# Patient Record
Sex: Female | Born: 1980 | Race: White | Hispanic: Yes | Marital: Married | Smoking: Never smoker
Health system: Southern US, Community
[De-identification: ages and names within clinical notes are randomized; demographics above are authoritative.]

## PROBLEM LIST (undated history)

## (undated) DIAGNOSIS — O039 Complete or unspecified spontaneous abortion without complication: Secondary | ICD-10-CM

## (undated) DIAGNOSIS — O24419 Gestational diabetes mellitus in pregnancy, unspecified control: Secondary | ICD-10-CM

## (undated) DIAGNOSIS — F419 Anxiety disorder, unspecified: Secondary | ICD-10-CM

## (undated) DIAGNOSIS — R7303 Prediabetes: Secondary | ICD-10-CM

## (undated) HISTORY — DX: Complete or unspecified spontaneous abortion without complication: O03.9

## (undated) HISTORY — DX: Anxiety disorder, unspecified: F41.9

## (undated) HISTORY — DX: Gestational diabetes mellitus in pregnancy, unspecified control: O24.419

## (undated) HISTORY — PX: DILATION AND CURETTAGE OF UTERUS: SHX78

---

## 2005-10-22 ENCOUNTER — Emergency Department: Payer: Self-pay | Admitting: Emergency Medicine

## 2005-12-04 ENCOUNTER — Ambulatory Visit: Payer: Self-pay | Admitting: Obstetrics and Gynecology

## 2006-06-15 ENCOUNTER — Inpatient Hospital Stay: Payer: Self-pay | Admitting: Obstetrics and Gynecology

## 2008-06-20 ENCOUNTER — Ambulatory Visit: Payer: Self-pay | Admitting: Internal Medicine

## 2013-05-26 HISTORY — PX: AUGMENTATION MAMMAPLASTY: SUR837

## 2014-07-26 ENCOUNTER — Ambulatory Visit (INDEPENDENT_AMBULATORY_CARE_PROVIDER_SITE_OTHER): Payer: Self-pay | Admitting: Sports Medicine

## 2014-07-26 VITALS — BP 100/62 | HR 67 | Temp 97.7°F | Ht 62.0 in | Wt 148.4 lb

## 2014-07-26 DIAGNOSIS — M545 Low back pain, unspecified: Secondary | ICD-10-CM

## 2014-07-26 MED ORDER — MELOXICAM 15 MG PO TABS: 15.0000 mg | ORAL_TABLET | Freq: Every morning | ORAL | Status: DC

## 2014-07-26 MED ORDER — CYCLOBENZAPRINE HCL 5 MG PO TABS: 5.0000 mg | ORAL_TABLET | Freq: Every day | ORAL | Status: DC

## 2014-07-26 NOTE — Progress Notes (Signed)
  Kristie ChampagneClaudia Plata Ornelas - 34 y.o. female MRN [id number]  Date of birth: 1980-09-17  SUBJECTIVE:  Including CC & ROS.  patient C/O: low back pain  Onset of symptoms: 08/20/14 after pushing something at home  Symptoms: low back muscle pain that cramping in nature across the back not isolated to one side. Diffuse soreness with no radiation of pain into the buttock, thighs or lower legs. No numbness no tingling or sharp shooting pain Relieving factors: nothing OTC Worsened by: standing for long period of time and difficulty to find a comfortable position when laying down   ROS:  Constitutional:  No fever, chills, or fatigue.  Respiratory:  No shortness of breath, cough, or wheezing Cardiovascular:  No palpitations, chest pain or syncope Gastrointestinal:  No nausea, no abdominal pain Review of systems otherwise negative except for what is stated in HPI  HISTORY: Past Medical, Surgical, Social, and Family History Reviewed & Updated per EMR. Pertinent Historical Findings include: No chronic medication problems or medications  PHYSICAL EXAM:  VS: BP:100/62 mmHg  HR:67bpm  TEMP:97.7 F (36.5 C)(Oral)  RESP:99 %  HT:5\' 2"  (157.5 cm)   WT:148 lb 6 oz (67.302 kg)  BMI:27.2 LOW BACK EXAM: General: well nourished Skin of LE: warm; dry, no rashes, lesions, ecchymosis or erythema. Vascular: radial pulses 2+ bilaterally Neurologically: Sensation to light touch lower extremities equal and intact bilaterally.  Observation: Normal curvature and no kyphosis or lordosis, no scoliosis.  Iliac crests are symmetric, shoulders line symmetrically Palpation:  No step off defects noted in the thoracic or lumbar spine.   No muscle spasm or tenderness along the paraspinal musculature of the thoracic No muscle spasm or tenderness along the paraspinal musculature of the lumbar spine. Range of motion:  Normal flexion on forward bending, no pain with extension, no pain with one leg hyperextension. Neuromuscular:  No pain with straight leg raise left or right, normal gait walking with walking on heels or walking on toes, normal tandem gait.  Nerve root intervention  L2 and L3: Normal hip flexion with no weakness. L2, L3, L4: Normal hip abduction bilaterally.  Normal patellar DTR +2 bilaterally L4, L5, S1: Normal hip abduction bilaterally S1 and S2: Normal ankle plantar-flexion bilaterally.  Normal Achilles tendon DTR +2 bilaterally L5: Normal extensor hallucis longus bilaterally   ASSESSMENT & PLAN:  Impression: Lumbar back pain without sciatica   Patient is a 34 year old Hispanic female presented with low back pain mechanical in nature. Patient's history did not specify any symptoms of radicular symptoms numbness or tingling in the lower extremity. Examination did not reveal any reproducible radicular symptoms no weakness normal sensation. Suspect mechanical back pain for muscle spasms. Will treat with anti-inflammatories and muscle relaxer. Patient was started on low back 50 mg daily with breakfast for the next 2 weeks. Also provided a muscle relaxer to be used at night as needed. Patient advised to follow-up in 3 or 4 weeks if symptoms not improved. Also given some lumbar flexibility exercises to work on stretching. Patient reports she is able to do her job with no limitations.

## 2016-10-08 ENCOUNTER — Emergency Department
Admission: EM | Admit: 2016-10-08 | Discharge: 2016-10-08 | Disposition: A | Discharge status: Home / Self Care | Payer: BLUE CROSS/BLUE SHIELD | Attending: Emergency Medicine | Admitting: Emergency Medicine

## 2016-10-08 ENCOUNTER — Emergency Department: Payer: BLUE CROSS/BLUE SHIELD

## 2016-10-08 DIAGNOSIS — B373 Candidiasis of vulva and vagina: Secondary | ICD-10-CM | POA: Diagnosis not present

## 2016-10-08 DIAGNOSIS — B3731 Acute candidiasis of vulva and vagina: Secondary | ICD-10-CM

## 2016-10-08 DIAGNOSIS — R1032 Left lower quadrant pain: Secondary | ICD-10-CM | POA: Insufficient documentation

## 2016-10-08 LAB — COMPREHENSIVE METABOLIC PANEL
ALK PHOS: 63 U/L (ref 38–126)
ALT: 38 U/L (ref 14–54)
ANION GAP: 7 (ref 5–15)
AST: 51 U/L — ABNORMAL HIGH (ref 15–41)
Albumin: 4.2 g/dL (ref 3.5–5.0)
BUN: 16 mg/dL (ref 6–20)
CALCIUM: 8.6 mg/dL — AB (ref 8.9–10.3)
CO2: 24 mmol/L (ref 22–32)
Chloride: 105 mmol/L (ref 101–111)
Creatinine, Ser: 0.68 mg/dL (ref 0.44–1.00)
Glucose, Bld: 128 mg/dL — ABNORMAL HIGH (ref 65–99)
Potassium: 3.7 mmol/L (ref 3.5–5.1)
SODIUM: 136 mmol/L (ref 135–145)
TOTAL PROTEIN: 7.4 g/dL (ref 6.5–8.1)
Total Bilirubin: 0.6 mg/dL (ref 0.3–1.2)

## 2016-10-08 LAB — URINALYSIS, COMPLETE (UACMP) WITH MICROSCOPIC
BILIRUBIN URINE: NEGATIVE
Bacteria, UA: NONE SEEN
GLUCOSE, UA: NEGATIVE mg/dL
HGB URINE DIPSTICK: NEGATIVE
KETONES UR: NEGATIVE mg/dL
LEUKOCYTES UA: NEGATIVE
NITRITE: NEGATIVE
PROTEIN: NEGATIVE mg/dL
Specific Gravity, Urine: 1.024 (ref 1.005–1.030)
pH: 6 (ref 5.0–8.0)

## 2016-10-08 LAB — CBC
HCT: 38 % (ref 35.0–47.0)
HEMOGLOBIN: 13.2 g/dL (ref 12.0–16.0)
MCH: 31.7 pg (ref 26.0–34.0)
MCHC: 34.8 g/dL (ref 32.0–36.0)
MCV: 91.2 fL (ref 80.0–100.0)
PLATELETS: 274 10*3/uL (ref 150–440)
RBC: 4.17 MIL/uL (ref 3.80–5.20)
RDW: 12.9 % (ref 11.5–14.5)
WBC: 8.5 10*3/uL (ref 3.6–11.0)

## 2016-10-08 LAB — WET PREP, GENITAL
CLUE CELLS WET PREP: NONE SEEN
Sperm: NONE SEEN
Trich, Wet Prep: NONE SEEN

## 2016-10-08 LAB — CHLAMYDIA/NGC RT PCR (ARMC ONLY)
CHLAMYDIA TR: NOT DETECTED
N GONORRHOEAE: NOT DETECTED

## 2016-10-08 LAB — POCT PREGNANCY, URINE: PREG TEST UR: NEGATIVE

## 2016-10-08 LAB — LIPASE, BLOOD: Lipase: 23 U/L (ref 11–51)

## 2016-10-08 MED ORDER — IOPAMIDOL (ISOVUE-300) INJECTION 61%
100.0000 mL | Freq: Once | INTRAVENOUS | Status: AC | PRN
  Administered 2016-10-08: 100 mL via INTRAVENOUS

## 2016-10-08 MED ORDER — FLUCONAZOLE 100 MG PO TABS
150.0000 mg | ORAL_TABLET | Freq: Once | ORAL | Status: AC
  Administered 2016-10-08: 21:00:00 150 mg via ORAL
  Filled 2016-10-08: qty 1

## 2016-10-08 MED ORDER — IOPAMIDOL (ISOVUE-300) INJECTION 61%
30.0000 mL | Freq: Once | INTRAVENOUS | Status: AC
  Administered 2016-10-08: 30 mL via ORAL

## 2016-10-08 MED ORDER — ONDANSETRON HCL 4 MG/2ML IJ SOLN
INTRAMUSCULAR | Status: AC
  Administered 2016-10-08: 4 mg via INTRAVENOUS
  Filled 2016-10-08: qty 2

## 2016-10-08 MED ORDER — IBUPROFEN 800 MG PO TABS
800.0000 mg | ORAL_TABLET | Freq: Once | ORAL | Status: AC
  Administered 2016-10-08: 800 mg via ORAL
  Filled 2016-10-08: qty 1

## 2016-10-08 MED ORDER — ONDANSETRON HCL 4 MG/2ML IJ SOLN
4.0000 mg | Freq: Once | INTRAMUSCULAR | Status: AC
  Administered 2016-10-08: 4 mg via INTRAVENOUS

## 2016-10-08 NOTE — ED Notes (Signed)
Dr. Pershing ProudSchaevitz and [place] Villita NationHiram, interpreter at bedside.

## 2016-10-08 NOTE — ED Notes (Signed)
Pt sitting in chair in room with visitor. No distress noted. Hiram, spanish interpreter and DR. Lord at bedside  LLQ abd pain x 3 days. This past week pain is constant and stabbing. Sudden movement brings pain to pelvic area. Denies urinary pain or blood in urine. States first week of pain stated frequency. Denies chance of pregnancy, denies pain in back. Denies vaginal discharge. Denies diarrhea, constipation, N&V, fever.

## 2016-10-08 NOTE — ED Provider Notes (Signed)
[facility] Emergency Department Provider Note ____________________________________________   I have reviewed the triage vital signs and the triage nursing note.  HISTORY  Chief Complaint Abdominal Pain   Historian Patient and spouse  HPI Kristie Stevens is a 36 y.o. female presenting with several weeks but worsening few days with umbilical and left lower quadrant pain which is intermittent and at times sharp.  Occasional nausea without vomiting. No cuts patient diarrhea. Denies abnormal vaginal bleeding. Denies any abnormal vaginal discharge. No fevers. Pain is moderate to severe at times and currently moderate and made worse by movements.    History reviewed. No pertinent past medical history.  There are no active problems to display for this patient.   Past Surgical History:  Procedure Laterality Date  . CESAREAN SECTION      Prior to Admission medications   Medication Sig Start Date End Date Taking? Authorizing Provider  cyclobenzaprine (FLEXERIL) 5 MG tablet Take 1 tablet (5 mg total) by mouth at bedtime. Patient not taking: Reported on 10/08/2016 07/26/14   Didiano, Deanna M, DO  meloxicam (MOBIC) 15 MG tablet Take 1 tablet (15 mg total) by mouth every morning. With breakfast for 14 days then as needed Patient not taking: Reported on 10/08/2016 07/26/14   Didiano, Deanna M, DO    No Known Allergies  Family History  Problem Relation Age of Onset  . Hyperlipidemia Father   . Hyperlipidemia Brother   . Diabetes Paternal Grandmother     Social History Social History  Substance Use Topics  . Smoking status: Never Smoker  . Smokeless tobacco: Never Used  . Alcohol use 0.0 oz/week     Comment: occ    Review of Systems  Constitutional: Negative for fever. Eyes: Negative for visual changes. ENT: Negative for sore throat. Cardiovascular: Negative for chest pain. Respiratory: Negative for shortness of breath. Gastrointestinal: Negative  for vomiting and diarrhea. Genitourinary: Negative for dysuria. Musculoskeletal: Negative for back pain. Skin: Negative for rash. Neurological: Negative for headache. ____________________________________________   PHYSICAL EXAM:  VITAL SIGNS: ED Triage Vitals  Enc Vitals Group     BP 10/08/16 1821 116/80     Pulse Rate 10/08/16 1821 92     Resp 10/08/16 1821 18     Temp 10/08/16 1821 99 F (37.2 C)     Temp Source 10/08/16 1821 Oral     SpO2 10/08/16 1821 100 %     Weight 10/08/16 1828 155 lb (70.3 kg)     Height 10/08/16 1828 5\' 2"  (1.575 m)     Head Circumference --      Peak Flow --      Pain Score 10/08/16 1821 5     Pain Loc --      Pain Edu? --      Excl. in GC? --      Constitutional: Alert and oriented. Well appearing and in no distress. HEENT   Head: Normocephalic and atraumatic.      Eyes: Conjunctivae are normal. PER      Ears:         Nose: No congestion/rhinnorhea.   Mouth/Throat: Mucous membranes are moist.   Neck: No stridor. Cardiovascular/Chest: Normal rate, regular rhythm.  No murmurs, rubs, or gallops. Respiratory: Normal respiratory effort without tachypnea nor retractions. Breath sounds are clear and equal bilaterally. No wheezes/rales/rhonchi. Gastrointestinal: Soft. No distention, no guarding, no rebound. Moderate left lower quadrant tenderness to palpation.  Genitourinary/rectal: Small amount of vaginal discharge. No cervicitis or adnexal  mass or tenderness on pelvic bimanual exam. Musculoskeletal: Nontender with normal range of motion in all extremities. No joint effusions.  No lower extremity tenderness.  No edema. Neurologic:  Normal speech and language. No gross or focal neurologic deficits are appreciated. Skin:  Skin is warm, dry and intact. No rash noted. Psychiatric: Mood and affect are normal. Speech and behavior are normal. Patient exhibits appropriate insight and  judgment.   ____________________________________________  LABS (pertinent positives/negatives)  Labs Reviewed  WET PREP, GENITAL - Abnormal; Notable for the following:       Result Value   Yeast Wet Prep HPF POC PRESENT (*)    WBC, Wet Prep HPF POC FEW (*)    All other components within normal limits  COMPREHENSIVE METABOLIC PANEL - Abnormal; Notable for the following:    Glucose, Bld 128 (*)    Calcium 8.6 (*)    AST 51 (*)    All other components within normal limits  URINALYSIS, COMPLETE (UACMP) WITH MICROSCOPIC - Abnormal; Notable for the following:    Color, Urine YELLOW (*)    APPearance CLEAR (*)    Squamous Epithelial / LPF 0-5 (*)    All other components within normal limits  CHLAMYDIA/NGC RT PCR ([facility] ONLY)  LIPASE, BLOOD  CBC  POCT PREGNANCY, URINE    ____________________________________________    EKG I, Governor Rooksebecca Matthewjames Petrasek, MD, the attending physician have personally viewed and interpreted all ECGs.  None ____________________________________________  RADIOLOGY All Xrays were viewed by me. Imaging interpreted by Radiologist.  Pelvic and transvaginal ultrasound:  IMPRESSION: Normal pelvic ultrasound.  No acute abnormality identified.  CT abdomen and pelvis with contrast: Pending __________________________________________  PROCEDURES  Procedure(s) performed: None  Critical Care performed: None  ____________________________________________   ED COURSE / ASSESSMENT AND PLAN  Pertinent labs & imaging results that were available during my care of the patient were reviewed by me and considered in my medical decision making (see chart for details).   There is diminished interpreter, patient describes left lower quadrant pain wet with some worsening with movements and some located more periumbilical. Ultrasound is reassuring. Urinalysis is reassuring. Ultrasound is normal.  I will obtain CT of abdomen and pelvis for further  investigation.  Given dose of Diflucan for yeast.  Patient care to be transferred to Dr. Langston MaskerShaevitz at shift change 9 PM. CT abdomen and pelvis is pending.   CONSULTATIONS: None   Patient / Family / Caregiver informed of clinical course, medical decision-making process, and agree with plan.    ___________________________________________   FINAL CLINICAL IMPRESSION(S) / ED DIAGNOSES   Final diagnoses:  Left lower quadrant pain              Note: This dictation was prepared with Dragon dictation. Any transcriptional errors that result from this process are unintentional    Governor RooksLord, Jewel Mcafee, MD 10/08/16 2047

## 2016-10-08 NOTE — ED Notes (Signed)
Pt returned from CT via stretcher.

## 2016-10-08 NOTE — ED Triage Notes (Signed)
Pt presents with c/o of LLQ abdominal pain that has been going on for 2 weeks. Pt states it has gotten worse in the last week. Pt denies any pain with urination, bleeding or vaginal discharge. Denies N/V.

## 2016-12-18 ENCOUNTER — Other Ambulatory Visit: Payer: Self-pay | Admitting: Obstetrics and Gynecology

## 2016-12-18 DIAGNOSIS — Z1231 Encounter for screening mammogram for malignant neoplasm of breast: Secondary | ICD-10-CM

## 2017-01-29 DIAGNOSIS — L819 Disorder of pigmentation, unspecified: Secondary | ICD-10-CM | POA: Insufficient documentation

## 2017-03-30 ENCOUNTER — Other Ambulatory Visit: Payer: Self-pay | Admitting: Obstetrics and Gynecology

## 2017-03-30 DIAGNOSIS — Z9882 Breast implant status: Secondary | ICD-10-CM

## 2017-03-30 DIAGNOSIS — N644 Mastodynia: Secondary | ICD-10-CM

## 2017-04-02 ENCOUNTER — Ambulatory Visit
Admission: RE | Admit: 2017-04-02 | Discharge: 2017-04-02 | Disposition: A | Discharge status: Home / Self Care | Payer: BLUE CROSS/BLUE SHIELD | Source: Ambulatory Visit | Attending: Obstetrics and Gynecology | Admitting: Obstetrics and Gynecology

## 2017-04-02 DIAGNOSIS — N644 Mastodynia: Secondary | ICD-10-CM | POA: Diagnosis present

## 2017-04-02 DIAGNOSIS — Z9882 Breast implant status: Secondary | ICD-10-CM | POA: Diagnosis present

## 2017-11-13 LAB — OB RESULTS CONSOLE HEPATITIS B SURFACE ANTIGEN: Hepatitis B Surface Ag: NEGATIVE

## 2017-11-13 LAB — OB RESULTS CONSOLE VARICELLA ZOSTER ANTIBODY, IGG: Varicella: IMMUNE

## 2017-11-13 LAB — OB RESULTS CONSOLE RUBELLA ANTIBODY, IGM: Rubella: IMMUNE

## 2017-11-13 LAB — OB RESULTS CONSOLE HIV ANTIBODY (ROUTINE TESTING): HIV: NONREACTIVE

## 2018-03-24 ENCOUNTER — Ambulatory Visit: Payer: BLUE CROSS/BLUE SHIELD | Admitting: *Deleted

## 2018-03-25 ENCOUNTER — Encounter: Payer: Self-pay | Admitting: Dietician

## 2018-03-25 ENCOUNTER — Ambulatory Visit: Payer: Medicaid Other | Admitting: Dietician

## 2018-03-25 ENCOUNTER — Encounter: Payer: Medicaid Other | Attending: Obstetrics and Gynecology | Admitting: Dietician

## 2018-03-25 VITALS — BP 106/68 | Ht 62.0 in | Wt 172.3 lb

## 2018-03-25 DIAGNOSIS — O24419 Gestational diabetes mellitus in pregnancy, unspecified control: Secondary | ICD-10-CM | POA: Diagnosis not present

## 2018-03-25 DIAGNOSIS — Z3A00 Weeks of gestation of pregnancy not specified: Secondary | ICD-10-CM | POA: Diagnosis not present

## 2018-03-25 DIAGNOSIS — Z713 Dietary counseling and surveillance: Secondary | ICD-10-CM | POA: Insufficient documentation

## 2018-03-25 DIAGNOSIS — O24410 Gestational diabetes mellitus in pregnancy, diet controlled: Secondary | ICD-10-CM

## 2018-03-25 NOTE — Progress Notes (Signed)
Appt. Start Time:1045 Appt. End Time: 1215  GDM Class 1 Diabetes Overview - define DM; state own type of DM; identify functions of pancreas and insulin; define insulin deficiency vs insulin resistance  Psychosocial - identify DM as a source of stress; state the effects of stress on BG control; verbalize appropriate stress management techniques; identify personal stress issues   Nutritional Management - describe effects of food on blood glucose; identify sources of carbohydrate, protein and fat; verbalize the importance of balance meals in controlling blood glucose; identify meals as well balanced or not; estimate servings of carbohydrate from menus; use food labels to identify servings size, content of carbohydrate, fiber, protein, fat, saturated fat and sodium; recognize food sources of fat, saturated fat, trans fat, sodium and verbalize goals for intake; describe healthful appropriate food choices when dining out   Exercise - describe the effects of exercise on blood glucose and importance of regular exercise in controlling diabetes; state a plan for personal exercise; verbalize contraindications for exercise  Medications - state name, dose, timing of currently prescribed medications; describe types of medications available for diabetes  Insulin Training - prepare and administer insulin accurately; state correct rotation pattern; verbalize safe and lawful needle disposal  Self-Monitoring - state importance of HBGM and demo procedure accurately; use HBGM results to effectively manage diabetes; identify importance of regular HbA1C testing and goals for results  Acute Complications/Sick Day Guidelines - recognize hyperglycemia and hypoglycemia with causes and effects; identify blood glucose results as high, low or in control; list steps in treating and preventing high and low blood glucose; state appropriate measure to manage blood glucose when ill (need for meds, HBGM plan, when to call physician,  need for fluids)  Chronic Complications/Foot, Skin, Eye Dental Care - identify possible long-term complications of diabetes (retinopathy, neuropathy, nephropathy, cardiovascular disease, infections); explain steps in prevention and treatment of chronic complications; state importance of daily self-foot exams; describe how to examine feet and what to look for; explain appropriate eye and dental care  Lifestyle Changes/Goals & Health/Community Resources - state benefits of making appropriate lifestyle changes; identify habits that need to change (meals, tobacco, alcohol); identify strategies to reduce risk factors for personal health; set goals for proper diabetes care; state need for and frequency of healthcare follow-up; describe appropriate community resources for good health (ADA, web sites, apps)   Pregnancy/Sexual Health - define gestational diabetes; state importance of good blood glucose control and birth control prior to pregnancy; state importance of good blood glucose control in preventing sexual problems (impotence, vaginal dryness, infections, loss of desire); state relationship of blood glucose control and pregnancy outcome; describe risk of maternal and fetal complications  Teaching Materials Used: Meter-Accuchek Guide Me General Meal Planning Guidelines Daily Food Record Gestational Diabetes Booklet Gestational Video Goals for Healthy Pregnancy

## 2018-03-30 ENCOUNTER — Ambulatory Visit: Payer: Medicaid Other | Admitting: Dietician

## 2018-03-31 ENCOUNTER — Encounter: Payer: Self-pay | Admitting: Dietician

## 2018-03-31 ENCOUNTER — Encounter: Payer: Medicaid Other | Attending: Obstetrics and Gynecology | Admitting: Dietician

## 2018-03-31 VITALS — BP 90/50 | Ht 62.0 in | Wt 172.8 lb

## 2018-03-31 DIAGNOSIS — Z713 Dietary counseling and surveillance: Secondary | ICD-10-CM | POA: Insufficient documentation

## 2018-03-31 DIAGNOSIS — O24419 Gestational diabetes mellitus in pregnancy, unspecified control: Secondary | ICD-10-CM | POA: Insufficient documentation

## 2018-03-31 DIAGNOSIS — Z3A00 Weeks of gestation of pregnancy not specified: Secondary | ICD-10-CM | POA: Diagnosis not present

## 2018-03-31 DIAGNOSIS — O24410 Gestational diabetes mellitus in pregnancy, diet controlled: Secondary | ICD-10-CM

## 2018-03-31 NOTE — Progress Notes (Signed)
   Patient's BG record indicates BGs are within goal ranges, with fasting BGs ranging 74-93, and post-meal BGs ranging 67-105, with 2 readings of 125 after eating oatmeal (patient states no added sugar, small portion).  Patient's food diary indicates healthy food choices, with most meals low in carbohydrates.    Provided 1600-1700kcal meal plan, and wrote individualized menus based on patient's food preferences. Encouraged appropriate portions of healthy carbohydrates. Instructed patient on basic meal planning to achieve good nutritional balance.   Instructed patient on food safety, including avoidance of Listeriosis, and limiting mercury from fish.  Discussed importance of maintaining healthy lifestyle habits to reduce risk of Type 2 DM as well as Gestational DM with any future pregnancies.  Advised patient to use any remaining testing supplies to test some BGs after delivery, and to have BG tested ideally annually, as well as prior to attempting future pregnancies.

## 2018-03-31 NOTE — Patient Instructions (Addendum)
   Try "Steel Cut" oats for breakfast, and keep to 1/2 cup before cooking (1 cup after cooking). Add 1-2 boiled eggs (huevos) or 1/4 cup nuts (nueces) for protein and fat for a balanced meal.  OK to add some carbohydrate into meals, in small- medium portions.   Good job eating healthy foods and staying active.

## 2018-04-26 LAB — OB RESULTS CONSOLE GBS: GBS: NEGATIVE

## 2018-04-26 LAB — OB RESULTS CONSOLE GC/CHLAMYDIA
Chlamydia: NEGATIVE
Gonorrhea: NEGATIVE

## 2018-04-26 LAB — OB RESULTS CONSOLE RPR: RPR: NONREACTIVE

## 2018-05-20 ENCOUNTER — Other Ambulatory Visit: Payer: Self-pay

## 2018-05-20 ENCOUNTER — Inpatient Hospital Stay: Payer: Medicaid Other | Admitting: Anesthesiology

## 2018-05-20 ENCOUNTER — Inpatient Hospital Stay
Admission: EM | Admit: 2018-05-20 | Discharge: 2018-05-23 | DRG: 787 | Disposition: A | Discharge status: Home / Self Care | Payer: Medicaid Other | Attending: Obstetrics and Gynecology | Admitting: Obstetrics and Gynecology

## 2018-05-20 DIAGNOSIS — Z3A40 40 weeks gestation of pregnancy: Secondary | ICD-10-CM | POA: Diagnosis not present

## 2018-05-20 DIAGNOSIS — O4292 Full-term premature rupture of membranes, unspecified as to length of time between rupture and onset of labor: Principal | ICD-10-CM | POA: Diagnosis present

## 2018-05-20 DIAGNOSIS — O9081 Anemia of the puerperium: Secondary | ICD-10-CM | POA: Diagnosis not present

## 2018-05-20 DIAGNOSIS — O34211 Maternal care for low transverse scar from previous cesarean delivery: Secondary | ICD-10-CM | POA: Diagnosis present

## 2018-05-20 DIAGNOSIS — D62 Acute posthemorrhagic anemia: Secondary | ICD-10-CM | POA: Diagnosis not present

## 2018-05-20 DIAGNOSIS — O24420 Gestational diabetes mellitus in childbirth, diet controlled: Secondary | ICD-10-CM | POA: Diagnosis present

## 2018-05-20 LAB — COMPREHENSIVE METABOLIC PANEL WITH GFR
ALT: 15 U/L (ref 0–44)
AST: 17 U/L (ref 15–41)
Albumin: 2.9 g/dL — ABNORMAL LOW (ref 3.5–5.0)
Alkaline Phosphatase: 169 U/L — ABNORMAL HIGH (ref 38–126)
Anion gap: 8 (ref 5–15)
BUN: 10 mg/dL (ref 6–20)
CO2: 21 mmol/L — ABNORMAL LOW (ref 22–32)
Calcium: 8.9 mg/dL (ref 8.9–10.3)
Chloride: 107 mmol/L (ref 98–111)
Creatinine, Ser: 0.48 mg/dL (ref 0.44–1.00)
GFR calc Af Amer: >60 mL/min
GFR calc non Af Amer: >60 mL/min
Glucose, Bld: 68 mg/dL — ABNORMAL LOW (ref 70–99)
Potassium: 3.6 mmol/L (ref 3.5–5.1)
Sodium: 136 mmol/L (ref 135–145)
Total Bilirubin: 0.9 mg/dL (ref 0.3–1.2)
Total Protein: 6.4 g/dL — ABNORMAL LOW (ref 6.5–8.1)

## 2018-05-20 LAB — RAPID HIV SCREEN (HIV 1/2 AB+AG)
HIV 1/2 Antibodies: NONREACTIVE
HIV-1 P24 Antigen - HIV24: NONREACTIVE

## 2018-05-20 LAB — CBC
HCT: 40.2 % (ref 36.0–46.0)
Hemoglobin: 13.8 g/dL (ref 12.0–15.0)
MCH: 32.9 pg (ref 26.0–34.0)
MCHC: 34.3 g/dL (ref 30.0–36.0)
MCV: 95.9 fL (ref 80.0–100.0)
Platelets: 246 K/uL (ref 150–400)
RBC: 4.19 MIL/uL (ref 3.87–5.11)
RDW: 13.1 % (ref 11.5–15.5)
WBC: 13.9 K/uL — ABNORMAL HIGH (ref 4.0–10.5)
nRBC: 0 % (ref 0.0–0.2)

## 2018-05-20 LAB — TYPE AND SCREEN
ABO/RH(D): O POS
ANTIBODY SCREEN: NEGATIVE

## 2018-05-20 LAB — PROTEIN / CREATININE RATIO, URINE
Creatinine, Urine: 118 mg/dL
Protein Creatinine Ratio: 0.18 mg/mg{Cre} — ABNORMAL HIGH (ref 0.00–0.15)
Total Protein, Urine: 21 mg/dL

## 2018-05-20 LAB — HEMOGLOBIN A1C
Hgb A1c MFr Bld: 5 % (ref 4.8–5.6)
Mean Plasma Glucose: 96.8 mg/dL

## 2018-05-20 LAB — GLUCOSE, CAPILLARY: Glucose-Capillary: 60 mg/dL — ABNORMAL LOW (ref 70–99)

## 2018-05-20 LAB — RUPTURE OF MEMBRANE (ROM)PLUS: Rom Plus: POSITIVE

## 2018-05-20 MED ORDER — LIDOCAINE HCL (PF) 1 % IJ SOLN
INTRAMUSCULAR | Status: AC
  Filled 2018-05-20: qty 30

## 2018-05-20 MED ORDER — OXYTOCIN 40 UNITS IN LACTATED RINGERS INFUSION - SIMPLE MED
1.0000 m[IU]/min | INTRAVENOUS | Status: DC
  Administered 2018-05-20 – 2018-05-21 (×2): 2 m[IU]/min via INTRAVENOUS
  Filled 2018-05-20: qty 1000

## 2018-05-20 MED ORDER — LIDOCAINE HCL (PF) 1 % IJ SOLN: 30.0000 mL | INTRAMUSCULAR | Status: DC | PRN

## 2018-05-20 MED ORDER — DIPHENHYDRAMINE HCL 50 MG/ML IJ SOLN: 12.5000 mg | INTRAMUSCULAR | Status: DC | PRN

## 2018-05-20 MED ORDER — PHENYLEPHRINE 40 MCG/ML (10ML) SYRINGE FOR IV PUSH (FOR BLOOD PRESSURE SUPPORT): 80.0000 ug | PREFILLED_SYRINGE | INTRAVENOUS | Status: DC | PRN

## 2018-05-20 MED ORDER — OXYTOCIN 40 UNITS IN LACTATED RINGERS INFUSION - SIMPLE MED
2.5000 [IU]/h | INTRAVENOUS | Status: DC
  Administered 2018-05-21 (×3): 500 mL via INTRAVENOUS
  Filled 2018-05-20: qty 1000

## 2018-05-20 MED ORDER — ACETAMINOPHEN 325 MG PO TABS: 650.0000 mg | ORAL_TABLET | ORAL | Status: DC | PRN

## 2018-05-20 MED ORDER — SOD CITRATE-CITRIC ACID 500-334 MG/5ML PO SOLN: 30.0000 mL | ORAL | Status: DC | PRN

## 2018-05-20 MED ORDER — MISOPROSTOL 200 MCG PO TABS
ORAL_TABLET | ORAL | Status: AC
  Filled 2018-05-20: qty 4

## 2018-05-20 MED ORDER — BUTORPHANOL TARTRATE 2 MG/ML IJ SOLN: 1.0000 mg | INTRAMUSCULAR | Status: DC | PRN

## 2018-05-20 MED ORDER — ONDANSETRON HCL 4 MG/2ML IJ SOLN: 4.0000 mg | Freq: Four times a day (QID) | INTRAMUSCULAR | Status: DC | PRN

## 2018-05-20 MED ORDER — FENTANYL 2.5 MCG/ML W/ROPIVACAINE 0.15% IN NS 100 ML EPIDURAL (ARMC)
EPIDURAL | Status: AC
  Filled 2018-05-20: qty 100

## 2018-05-20 MED ORDER — OXYTOCIN 10 UNIT/ML IJ SOLN
INTRAMUSCULAR | Status: AC
  Filled 2018-05-20: qty 2

## 2018-05-20 MED ORDER — OXYTOCIN BOLUS FROM INFUSION: 500.0000 mL | Freq: Once | INTRAVENOUS | Status: DC

## 2018-05-20 MED ORDER — PRENATAL MULTIVITAMIN CH: 1.0000 | ORAL_TABLET | Freq: Every day | ORAL | Status: DC

## 2018-05-20 MED ORDER — EPHEDRINE 5 MG/ML INJ
10.0000 mg | INTRAVENOUS | Status: DC | PRN
  Administered 2018-05-21: 10 mg via INTRAVENOUS
  Filled 2018-05-20: qty 4

## 2018-05-20 MED ORDER — FENTANYL 2.5 MCG/ML W/ROPIVACAINE 0.15% IN NS 100 ML EPIDURAL (ARMC)
12.0000 mL/h | EPIDURAL | Status: DC
  Administered 2018-05-20 – 2018-05-21 (×2): 12 mL/h via EPIDURAL

## 2018-05-20 MED ORDER — LACTATED RINGERS IV SOLN
500.0000 mL | INTRAVENOUS | Status: DC | PRN
  Administered 2018-05-21: 500 mL via INTRAVENOUS

## 2018-05-20 MED ORDER — LACTATED RINGERS IV SOLN
INTRAVENOUS | Status: DC
  Administered 2018-05-20 – 2018-05-21 (×4): via INTRAVENOUS

## 2018-05-20 MED ORDER — LACTATED RINGERS IV SOLN
500.0000 mL | Freq: Once | INTRAVENOUS | Status: AC
  Administered 2018-05-21: 500 mL via INTRAVENOUS

## 2018-05-20 MED ORDER — DOCUSATE SODIUM 100 MG PO CAPS: 100.0000 mg | ORAL_CAPSULE | Freq: Every day | ORAL | Status: DC

## 2018-05-20 MED ORDER — AMMONIA AROMATIC IN INHA
RESPIRATORY_TRACT | Status: AC
  Filled 2018-05-20: qty 10

## 2018-05-20 MED ORDER — ZOLPIDEM TARTRATE 5 MG PO TABS: 5.0000 mg | ORAL_TABLET | Freq: Every evening | ORAL | Status: DC | PRN

## 2018-05-20 MED ORDER — CALCIUM CARBONATE ANTACID 500 MG PO CHEW: 2.0000 | CHEWABLE_TABLET | ORAL | Status: DC | PRN

## 2018-05-20 MED ORDER — EPHEDRINE 5 MG/ML INJ: 10.0000 mg | INTRAVENOUS | Status: DC | PRN

## 2018-05-20 NOTE — Progress Notes (Signed)
TRIAGE VISIT with NST   Kristie ChampagneClaudia Plata Stevens is a 37 y.o. G3P2000. She is at 685w2d gestation, presenting with signs of labor, contractions, prior C/S x2  Indication: Contractions  S: Resting comfortably. irregular CTX, no VB. Active fetal movement. Concerned about VBAC O:  BP 111/75 (BP Location: Right Arm)   Pulse 81   Temp 98.4 F (36.9 C) (Oral)   Resp 18   LMP 08/11/2017  No results found for this or any previous visit (from the past 48 hour(s)).   Gen: NAD, AAOx3      Abd: FNTTP      Ext: Non-tender, Nonedmeatous    NST/FHT: 145, mod var, +accels, no decels TOCO: quiet GNF:[id number]:Dilation: 1 Effacement (%): Thick Cervical Position: Posterior Station: -3 Presentation: Vertex Exam by:: Jamesetta GeraldsL Braddy, RN  NST: Reactive. See FHT above for particulars.  A/P:  37 y.o. G3P2000 765w2d with irregular contractions.   Labor: not present. Monitor for 2 hours  R/o ROM: ROM Plus pending  Fetal Wellbeing: NST reactive Reassuring Cat 1 tracing.

## 2018-05-20 NOTE — H&P (Signed)
OB ADMISSION/ HISTORY & PHYSICAL:  Admission Date: 05/20/2018 11:14 AM  Admit Diagnosis: PROM, prior c/s x2  Kristie Stevens is a 37 y.o. female presenting for contractions and rupture of membranes at 2200 last night. Clear fluid  Prenatal History: [id number] EDC : 05/18/2018, by Last Menstrual Period  [facility]  Prenatal course complicated by  - gDMA1 - prior macrosomia: last EFW 62% 10 days ago, normal fluid - cesarean section x2, no prior vaginal deliveries. VBAC calculator 33%. She is aware of this and that a cesarean might become clinically indicated and induction stopped short.  Medical / Surgical History :  Past medical history: History reviewed. No pertinent past medical history.   Past surgical history:  Past Surgical History:  Procedure Laterality Date  . AUGMENTATION MAMMAPLASTY Bilateral 2015   gel implants  . CESAREAN SECTION      Family History:  Family History  Problem Relation Age of Onset  . Hyperlipidemia Father   . Hyperlipidemia Brother   . Diabetes Paternal Grandmother      Social History:  reports that she has never smoked. She has never used smokeless tobacco. She reports that she does not drink alcohol or use drugs.   Allergies: Patient has no known allergies.    Current Medications at time of admission:  Prior to Admission medications   Medication Sig Start Date End Date Taking? Authorizing Provider  cyclobenzaprine (FLEXERIL) 5 MG tablet Take 1 tablet (5 mg total) by mouth at bedtime. Patient not taking: Reported on 10/08/2016 07/26/14   Didiano, Deanna M, DO  meloxicam (MOBIC) 15 MG tablet Take 1 tablet (15 mg total) by mouth every morning. With breakfast for 14 days then as needed Patient not taking: Reported on 10/08/2016 07/26/14   Didiano, Deanna M, DO  PRENATAL 28-0.8 MG TABS Take 1 tablet by mouth daily.    [provider]     Review of Systems: Active FM onset of ctx @ 12 hrs ago, currently every 3 minutes LOF   / SROM: 10pm 12/25 bloody show no   Physical Exam:  VS: Blood pressure 111/75, pulse 81, temperature 98.4 F (36.9 C), temperature source Oral, resp. rate 18, last menstrual period 08/11/2017.  General: alert and oriented, appears nad Heart: RRR Lungs: Clear lung fields Abdomen: Gravid, soft and non-tender, non-distended / uterus: non tender Extremities: trace edema  FHT: 145, moderate variability, +accels, no decels TOCO: SVE:  Dilation: 1 / Effacement (%): Thick / Station: -3    Cephalic by [employer]  Prenatal Labs: Blood type/Rh  o positive  Antibody screen neg  Rubella Immune  Varicella Immune  RPR NR  HBsAg Neg  HIV NR  GC neg  Chlamydia neg  Genetic screening negative  1 hour GTT failed  3 hour GTT 2 elevated values  GBS neg   No results found.  Assessment: 40+[redacted] weeks gestation 1 stage of labor FHR category 1   Plan:   Admit for PROM, prior c/s x2 Labs pending Epidural when desired Continuous fetal monitoring   1. Fetal Well being  - Fetal Tracing: cat I - Ultrasound:  reviewed, as above - Group B Streptococcus: neg - Presentation: vtx confirmed by Leopolds   2. Routine OB: - Prenatal labs reviewed, as above - Rh positive  3. Induction of Labor:  -  Contractions external toco in place -  [facility] and low dose pitocin. Will plan for c/s if needed  4. Post Partum Planning: -  Infant feeding: breast - Contraception: ocps

## 2018-05-20 NOTE — Progress Notes (Signed)
MD in room to discuss POC for IOL and at home testing for GDM. Plan is for pt to eat, then place foley bulb and possibly start pitocin. Pt agrees to POC, interpreter present for this discussion. MD advised of risks of IOL and reasons for potentially having to proceed with c/s if IOL doesn't work.

## 2018-05-20 NOTE — Progress Notes (Signed)
Kristie Stevens is a 37 y.o. G4P2010 at 7924w2d with PROM and prior C/S x2 - On pitocin of 782mu/min - s/p epidural  - cook cath in place  Subjective: Comfortable with epidural  Objective: BP (!) 101/55   Pulse 98   Temp 98.2 F (36.8 C) (Oral)   Resp 16   Ht 5\' 2"  (1.575 m)   Wt 78.9 kg   LMP 08/11/2017   BMI 31.83 kg/m  I/O last 3 completed shifts: In: 27.6 [I.V.:27.6] Out: -  No intake/output data recorded.  FHT:  FHR: 150 bpm, variability: moderate,  accelerations:  Present,  decelerations:  Present late decel rarely with accels in between UC:   regular, every 3 minutes SVE:   Dilation: 2 Effacement (%): Thick Station: -3 Exam by:: Dalbert GarnetBeasley MD  Labs: Lab Results  Component Value Date   WBC 13.9 (H) 05/20/2018   HGB 13.8 05/20/2018   HCT 40.2 05/20/2018   MCV 95.9 05/20/2018   PLT 246 05/20/2018    Assessment / Plan: Induction of labor due to PROM,  progressing well on pitocin  Labor: [facility] cath placed and pitocin started Fetal Wellbeing:  Category II - occassional late decels, will continue intrauterine resuscitation. S/p epidural Pain Control:  Epidural I/D:  no evidence of infection- maternal afebrile. WBC 13.9 on admission Anticipated MOD:  NSVD hoped for  Kristie DouglasBethany Mykle Stevens 05/20/2018, 11:05 PM

## 2018-05-20 NOTE — OB Triage Note (Signed)
Pt c/o ctx that are irregular, fluid leaking last night.

## 2018-05-20 NOTE — Anesthesia Preprocedure Evaluation (Addendum)
Anesthesia Evaluation  Patient identified by MRN, date of birth, ID band Patient awake    Reviewed: Allergy & Precautions, H&P , NPO status , Patient's Chart, lab work & pertinent test results, reviewed documented beta blocker date and time   Airway Mallampati: II  TM Distance: >3 FB Neck ROM: full    Dental no notable dental hx. (+) Teeth Intact   Pulmonary neg pulmonary ROS,    Pulmonary exam normal breath sounds clear to auscultation       Cardiovascular Exercise Tolerance: Good negative cardio ROS Normal cardiovascular exam Rhythm:regular Rate:Normal     Neuro/Psych negative neurological ROS  negative psych ROS   GI/Hepatic negative GI ROS, Neg liver ROS,   Endo/Other  negative endocrine ROS  Renal/GU negative Renal ROS  negative genitourinary   Musculoskeletal negative musculoskeletal ROS (+)   Abdominal   Peds negative pediatric ROS (+)  Hematology negative hematology ROS (+)   Anesthesia Other Findings   Reproductive/Obstetrics (+) Pregnancy                            Anesthesia Physical Anesthesia Plan  ASA: II and emergent  Anesthesia Plan: Epidural   Post-op Pain Management:    Induction:   PONV Risk Score and Plan:   Airway Management Planned: Natural Airway  Additional Equipment:   Intra-op Plan:   Post-operative Plan:   Informed Consent: I have reviewed the patients History and Physical, chart, labs and discussed the procedure including the risks, benefits and alternatives for the proposed anesthesia with the patient or authorized representative who has indicated his/her understanding and acceptance.   Dental advisory given  Plan Discussed with: CRNA and Surgeon  Anesthesia Plan Comments: (Pt is a TOLAC with a well functioning epidural, declared for an urgent c-section,.  Will proceed expeditiously with epidural for above.  JA)       Anesthesia Quick  Evaluation

## 2018-05-20 NOTE — Progress Notes (Signed)
Kristie Stevens is a 37 y.o. G4P2010 at 421w2d with PROM and prior C/S x2  Subjective: Comfortable with contractions without pain management  Objective: BP 119/66 (BP Location: Left Arm)   Pulse (!) 101   Temp 98 F (36.7 C) (Oral)   Resp 16   Ht 5\' 2"  (1.575 m)   Wt 78.9 kg   LMP 08/11/2017   BMI 31.83 kg/m  I/O last 3 completed shifts: In: 27.6 [I.V.:27.6] Out: -  No intake/output data recorded.  FHT:  FHR: 150 bpm, variability: moderate,  accelerations:  Present,  decelerations:  Present late decel rarely with accels in between UC:   regular, every 3 minutes SVE:   Dilation: 1 Effacement (%): Thick Station: -3 Exam by:: Cathe MonsB. Beaslley, MD  Labs: Lab Results  Component Value Date   WBC 13.9 (H) 05/20/2018   HGB 13.8 05/20/2018   HCT 40.2 05/20/2018   MCV 95.9 05/20/2018   PLT 246 05/20/2018    Assessment / Plan: Induction of labor due to PROM,  progressing well on pitocin  Labor: Adriana Simasook cath placed and pitocin started Fetal Wellbeing:  Category II Pain Control:  may have stadol or epidural as desired I/D:  no evidence of infection- maternal afebrile. WBC 13.9 on admission Anticipated MOD:  NSVD hoped for  Christeen DouglasBethany Nalee Lightle 05/20/2018, 8:29 PM

## 2018-05-21 ENCOUNTER — Encounter
Admission: EM | Disposition: A | Discharge status: Home / Self Care | Payer: Self-pay | Source: Home / Self Care | Attending: Obstetrics and Gynecology

## 2018-05-21 ENCOUNTER — Inpatient Hospital Stay: Admission: RE | Admit: 2018-05-21 | Payer: Medicaid Other | Source: Ambulatory Visit

## 2018-05-21 LAB — CBC
HCT: 35.9 % — ABNORMAL LOW (ref 36.0–46.0)
HCT: 32.4 % — ABNORMAL LOW (ref 36.0–46.0)
HEMOGLOBIN: 11.1 g/dL — AB (ref 12.0–15.0)
Hemoglobin: 12 g/dL (ref 12.0–15.0)
MCH: 32.3 pg (ref 26.0–34.0)
MCH: 32.6 pg (ref 26.0–34.0)
MCHC: 33.4 g/dL (ref 30.0–36.0)
MCHC: 34.3 g/dL (ref 30.0–36.0)
MCV: 96.5 fL (ref 80.0–100.0)
MCV: 95 fL (ref 80.0–100.0)
PLATELETS: 199 10*3/uL (ref 150–400)
Platelets: 206 10*3/uL (ref 150–400)
RBC: 3.72 MIL/uL — ABNORMAL LOW (ref 3.87–5.11)
RBC: 3.41 MIL/uL — ABNORMAL LOW (ref 3.87–5.11)
RDW: 13 % (ref 11.5–15.5)
RDW: 13 % (ref 11.5–15.5)
WBC: 14.9 10*3/uL — AB (ref 4.0–10.5)
WBC: 12.9 10*3/uL — ABNORMAL HIGH (ref 4.0–10.5)
nRBC: 0 % (ref 0.0–0.2)
nRBC: 0 % (ref 0.0–0.2)

## 2018-05-21 LAB — GLUCOSE, CAPILLARY: Glucose-Capillary: 80 mg/dL (ref 70–99)

## 2018-05-21 SURGERY — Surgical Case
Anesthesia: Epidural | Site: Abdomen

## 2018-05-21 MED ORDER — SOD CITRATE-CITRIC ACID 500-334 MG/5ML PO SOLN
30.0000 mL | ORAL | Status: AC
  Administered 2018-05-21: 30 mL via ORAL

## 2018-05-21 MED ORDER — ACETAMINOPHEN 500 MG PO TABS
1000.0000 mg | ORAL_TABLET | Freq: Four times a day (QID) | ORAL | Status: DC
  Administered 2018-05-21 – 2018-05-23 (×7): 1000 mg via ORAL
  Filled 2018-05-21 (×7): qty 2

## 2018-05-21 MED ORDER — OXYCODONE HCL 5 MG PO TABS
5.0000 mg | ORAL_TABLET | ORAL | Status: DC | PRN
  Administered 2018-05-21 – 2018-05-23 (×6): 5 mg via ORAL
  Filled 2018-05-21 (×6): qty 1

## 2018-05-21 MED ORDER — SODIUM CHLORIDE (PF) 0.9 % IJ SOLN
INTRAMUSCULAR | Status: AC
  Filled 2018-05-21: qty 50

## 2018-05-21 MED ORDER — LIDOCAINE HCL (PF) 2 % IJ SOLN
INTRAMUSCULAR | Status: DC | PRN
  Administered 2018-05-20: 4 mL via INTRADERMAL

## 2018-05-21 MED ORDER — MISOPROSTOL 200 MCG PO TABS
ORAL_TABLET | ORAL | Status: AC
  Filled 2018-05-21: qty 5

## 2018-05-21 MED ORDER — GABAPENTIN 300 MG PO CAPS
400.0000 mg | ORAL_CAPSULE | Freq: Every day | ORAL | Status: DC
  Administered 2018-05-22 (×2): 400 mg via ORAL
  Filled 2018-05-21 (×2): qty 1

## 2018-05-21 MED ORDER — BUPIVACAINE LIPOSOME 1.3 % IJ SUSP
20.0000 mL | Freq: Once | INTRAMUSCULAR | Status: DC
  Filled 2018-05-21: qty 20

## 2018-05-21 MED ORDER — FENTANYL CITRATE (PF) 100 MCG/2ML IJ SOLN
INTRAMUSCULAR | Status: DC | PRN
  Administered 2018-05-21: 50 ug via INTRAVENOUS
  Administered 2018-05-21 (×2): 25 ug via INTRAVENOUS

## 2018-05-21 MED ORDER — HYDROMORPHONE HCL 1 MG/ML IJ SOLN
1.0000 mg | INTRAMUSCULAR | Status: DC | PRN
  Administered 2018-05-21: 1 mg via INTRAVENOUS
  Filled 2018-05-21: qty 2

## 2018-05-21 MED ORDER — FENTANYL CITRATE (PF) 100 MCG/2ML IJ SOLN
25.0000 ug | INTRAMUSCULAR | Status: DC | PRN
  Administered 2018-05-21: 25 ug via INTRAVENOUS
  Administered 2018-05-21: 75 ug via INTRAVENOUS
  Filled 2018-05-21: qty 2

## 2018-05-21 MED ORDER — KETOROLAC TROMETHAMINE 30 MG/ML IJ SOLN: 30.0000 mg | Freq: Four times a day (QID) | INTRAMUSCULAR | Status: DC

## 2018-05-21 MED ORDER — BISACODYL 10 MG RE SUPP: 10.0000 mg | Freq: Every day | RECTAL | Status: DC | PRN

## 2018-05-21 MED ORDER — BUPIVACAINE HCL (PF) 0.5 % IJ SOLN
INTRAMUSCULAR | Status: AC
  Filled 2018-05-21: qty 30

## 2018-05-21 MED ORDER — FLEET ENEMA 7-19 GM/118ML RE ENEM: 1.0000 | ENEMA | Freq: Every day | RECTAL | Status: DC | PRN

## 2018-05-21 MED ORDER — PRENATAL MULTIVITAMIN CH
1.0000 | ORAL_TABLET | Freq: Every day | ORAL | Status: DC
  Administered 2018-05-22 – 2018-05-23 (×2): 1 via ORAL
  Filled 2018-05-21 (×2): qty 1

## 2018-05-21 MED ORDER — DIBUCAINE 1 % RE OINT: 1.0000 "application " | TOPICAL_OINTMENT | RECTAL | Status: DC | PRN

## 2018-05-21 MED ORDER — COCONUT OIL OIL: 1.0000 "application " | TOPICAL_OIL | Status: DC | PRN

## 2018-05-21 MED ORDER — SODIUM CHLORIDE 0.9 % IV SOLN
500.0000 mg | Freq: Once | INTRAVENOUS | Status: AC
  Administered 2018-05-21: 500 mg via INTRAVENOUS
  Filled 2018-05-21: qty 500

## 2018-05-21 MED ORDER — CEFAZOLIN SODIUM-DEXTROSE 2-4 GM/100ML-% IV SOLN
2.0000 g | INTRAVENOUS | Status: AC
  Administered 2018-05-21: 2 g via INTRAVENOUS
  Filled 2018-05-21: qty 100

## 2018-05-21 MED ORDER — DIPHENHYDRAMINE HCL 25 MG PO CAPS: 25.0000 mg | ORAL_CAPSULE | Freq: Four times a day (QID) | ORAL | Status: DC | PRN

## 2018-05-21 MED ORDER — PROPOFOL 10 MG/ML IV BOLUS
INTRAVENOUS | Status: DC | PRN
  Administered 2018-05-21 (×8): 20 ug via INTRAVENOUS
  Administered 2018-05-21: 10 ug via INTRAVENOUS
  Administered 2018-05-21 (×3): 20 ug via INTRAVENOUS
  Administered 2018-05-21: 10 ug via INTRAVENOUS
  Administered 2018-05-21 (×5): 20 ug via INTRAVENOUS

## 2018-05-21 MED ORDER — HYDROMORPHONE HCL 1 MG/ML IJ SOLN
INTRAMUSCULAR | Status: AC
  Filled 2018-05-21: qty 1

## 2018-05-21 MED ORDER — LACTATED RINGERS IV SOLN: INTRAVENOUS | Status: DC

## 2018-05-21 MED ORDER — TETANUS-DIPHTH-ACELL PERTUSSIS 5-2.5-18.5 LF-MCG/0.5 IM SUSP: 0.5000 mL | Freq: Once | INTRAMUSCULAR | Status: DC

## 2018-05-21 MED ORDER — MEASLES, MUMPS & RUBELLA VAC IJ SOLR
0.5000 mL | Freq: Once | INTRAMUSCULAR | Status: DC
  Filled 2018-05-21: qty 0.5

## 2018-05-21 MED ORDER — MENTHOL 3 MG MT LOZG
1.0000 | LOZENGE | OROMUCOSAL | Status: DC | PRN
  Filled 2018-05-21: qty 9

## 2018-05-21 MED ORDER — SODIUM CHLORIDE 0.9 % IV SOLN
INTRAVENOUS | Status: DC | PRN
  Administered 2018-05-21: 60 mL

## 2018-05-21 MED ORDER — METHYLERGONOVINE MALEATE 0.2 MG/ML IJ SOLN
INTRAMUSCULAR | Status: AC
  Filled 2018-05-21: qty 1

## 2018-05-21 MED ORDER — SIMETHICONE 80 MG PO CHEW
80.0000 mg | CHEWABLE_TABLET | Freq: Three times a day (TID) | ORAL | Status: DC
  Administered 2018-05-21 – 2018-05-23 (×6): 80 mg via ORAL
  Filled 2018-05-21 (×6): qty 1

## 2018-05-21 MED ORDER — EPHEDRINE 5 MG/ML INJ: 10.0000 mg | Freq: Once | INTRAVENOUS | Status: DC

## 2018-05-21 MED ORDER — SENNOSIDES-DOCUSATE SODIUM 8.6-50 MG PO TABS
2.0000 | ORAL_TABLET | ORAL | Status: DC
  Administered 2018-05-22 – 2018-05-23 (×2): 2 via ORAL
  Filled 2018-05-21 (×2): qty 2

## 2018-05-21 MED ORDER — FENTANYL CITRATE (PF) 100 MCG/2ML IJ SOLN
INTRAMUSCULAR | Status: AC
  Filled 2018-05-21: qty 2

## 2018-05-21 MED ORDER — LIDOCAINE-EPINEPHRINE (PF) 1.5 %-1:200000 IJ SOLN
INTRAMUSCULAR | Status: DC | PRN
  Administered 2018-05-20: 3 mL via PERINEURAL

## 2018-05-21 MED ORDER — CHLOROPROCAINE HCL (PF) 3 % IJ SOLN
INTRAMUSCULAR | Status: AC
  Filled 2018-05-21: qty 20

## 2018-05-21 MED ORDER — FENTANYL 2.5 MCG/ML W/ROPIVACAINE 0.15% IN NS 100 ML EPIDURAL (ARMC)
EPIDURAL | Status: AC
  Filled 2018-05-21: qty 100

## 2018-05-21 MED ORDER — HYDROMORPHONE HCL 1 MG/ML IJ SOLN
0.5000 mg | INTRAMUSCULAR | Status: DC | PRN
  Administered 2018-05-21: 0.5 mg via INTRAVENOUS

## 2018-05-21 MED ORDER — PROPOFOL 10 MG/ML IV BOLUS
INTRAVENOUS | Status: AC
  Filled 2018-05-21: qty 20

## 2018-05-21 MED ORDER — BUPIVACAINE HCL (PF) 0.25 % IJ SOLN
INTRAMUSCULAR | Status: DC | PRN
  Administered 2018-05-21: 30 mL

## 2018-05-21 MED ORDER — SOD CITRATE-CITRIC ACID 500-334 MG/5ML PO SOLN
ORAL | Status: AC
  Filled 2018-05-21: qty 15

## 2018-05-21 MED ORDER — BUPIVACAINE HCL (PF) 0.25 % IJ SOLN
INTRAMUSCULAR | Status: DC | PRN
  Administered 2018-05-20: 8 mL via EPIDURAL

## 2018-05-21 MED ORDER — SIMETHICONE 80 MG PO CHEW: 80.0000 mg | CHEWABLE_TABLET | ORAL | Status: DC | PRN

## 2018-05-21 MED ORDER — IBUPROFEN 800 MG PO TABS: 800.0000 mg | ORAL_TABLET | Freq: Four times a day (QID) | ORAL | Status: DC

## 2018-05-21 MED ORDER — WITCH HAZEL-GLYCERIN EX PADS: 1.0000 "application " | MEDICATED_PAD | CUTANEOUS | Status: DC | PRN

## 2018-05-21 MED ORDER — SIMETHICONE 80 MG PO CHEW: 80.0000 mg | CHEWABLE_TABLET | ORAL | Status: DC

## 2018-05-21 MED ORDER — CARBOPROST TROMETHAMINE 250 MCG/ML IM SOLN
INTRAMUSCULAR | Status: AC
  Filled 2018-05-21: qty 1

## 2018-05-21 MED ORDER — CHLOROPROCAINE HCL (PF) 3 % IJ SOLN
INTRAMUSCULAR | Status: DC | PRN
  Administered 2018-05-21 (×4): 5 mL

## 2018-05-21 MED ORDER — OXYTOCIN 40 UNITS IN LACTATED RINGERS INFUSION - SIMPLE MED
2.5000 [IU]/h | INTRAVENOUS | Status: AC
  Administered 2018-05-22: 2.5 [IU]/h via INTRAVENOUS
  Filled 2018-05-21: qty 1000

## 2018-05-21 MED ORDER — ONDANSETRON HCL 4 MG/2ML IJ SOLN: 4.0000 mg | Freq: Once | INTRAMUSCULAR | Status: DC | PRN

## 2018-05-21 MED ORDER — LIDOCAINE HCL (PF) 1 % IJ SOLN
INTRAMUSCULAR | Status: DC | PRN
  Administered 2018-05-20: 3 mL

## 2018-05-21 SURGICAL SUPPLY — 24 items
BARRIER ADHS 3X4 INTERCEED (GAUZE/BANDAGES/DRESSINGS) ×3 IMPLANT
CANISTER SUCT 3000ML PPV (MISCELLANEOUS) ×3 IMPLANT
CHLORAPREP W/TINT 26ML (MISCELLANEOUS) ×3 IMPLANT
COVER WAND RF STERILE (DRAPES) ×3 IMPLANT
DRSG OPSITE POSTOP 4X10 (GAUZE/BANDAGES/DRESSINGS) ×3 IMPLANT
DRSG TELFA 3X8 NADH (GAUZE/BANDAGES/DRESSINGS) ×6 IMPLANT
ELECT REM PT RETURN 9FT ADLT (ELECTROSURGICAL) ×3
ELECTRODE REM PT RTRN 9FT ADLT (ELECTROSURGICAL) ×1 IMPLANT
GAUZE SPONGE 4X4 12PLY STRL (GAUZE/BANDAGES/DRESSINGS) ×6 IMPLANT
GOWN STRL REUS W/ TWL LRG LVL3 (GOWN DISPOSABLE) ×3 IMPLANT
GOWN STRL REUS W/TWL LRG LVL3 (GOWN DISPOSABLE) ×6
NS IRRIG 1000ML POUR BTL (IV SOLUTION) ×3 IMPLANT
PAD OB MATERNITY 4.3X12.25 (PERSONAL CARE ITEMS) ×3 IMPLANT
PAD PREP 24X41 OB/GYN DISP (PERSONAL CARE ITEMS) ×3 IMPLANT
SPONGE LAP 18X18 RF (DISPOSABLE) ×6 IMPLANT
STAPLER INSORB 30 2030 C-SECTI (MISCELLANEOUS) ×3 IMPLANT
SUT MNCRL 4-0 (SUTURE) ×2
SUT MNCRL 4-0 27XMFL (SUTURE) ×1
SUT VIC AB 0 CT1 36 (SUTURE) ×6 IMPLANT
SUT VIC AB 0 CTX 36 (SUTURE) ×20
SUT VIC AB 0 CTX36XBRD ANBCTRL (SUTURE) ×10 IMPLANT
SUT VIC AB 2-0 SH 27 (SUTURE) ×4
SUT VIC AB 2-0 SH 27XBRD (SUTURE) ×2 IMPLANT
SUTURE MNCRL 4-0 27XMF (SUTURE) ×1 IMPLANT

## 2018-05-21 NOTE — Anesthesia Post-op Follow-up Note (Signed)
Anesthesia QCDR form completed.        

## 2018-05-21 NOTE — Discharge Summary (Signed)
Obstetrical Discharge Summary  Patient Name: Kristie Stevens DOB: 02-09-1981 MRN: [id number]  Date of Admission: 05/20/2018 Date of Discharge: 05/23/2018  [facility]   Gestational Age at Delivery: 2079w3d   Antepartum complications:  - gDMA1 - prior macrosomia: last EFW 62% 10 days ago, normal fluid - cesarean section x2, no prior vaginal deliveries. VBAC calculator 33%.   Admitting Diagnosis: PROM Secondary Diagnosis: Patient Active Problem List   Diagnosis Date Noted  . Full-term premature rupture of membranes 05/20/2018    Augmentation: Pitocin and Foley Balloon Complications: Hemorrhage>106500mL Intrapartum complications/course:  Failed induction, Non reassuring fetal heart tones, arrest of dilation, repeat C/S with significant adhesions and blood loss due to adhesions  Date of Delivery: 05/21/18 Delivered By: Christeen DouglasBethany Beasley Delivery Type: repeat cesarean section, low transverse incision Anesthesia: epidural Placenta: Extracted Laceration: none Episiotomy: none Newborn Data: Live born female "Diego" Birth Weight: 7 lb 14.3 oz (3580 g) APGAR: 9, 9  Newborn Delivery   Birth date/time:  05/21/2018 11:22:00 Delivery type:  C-Section, Low Transverse Trial of labor:  Yes C-section categorization:  Repeat     Discharge Physical Exam:  BP 101/72 (BP Location: Left Arm)   Pulse 87   Temp 99 F (37.2 C) (Oral)   Resp 20   Ht 5\' 2"  (1.575 m)   Wt 78.9 kg   LMP 08/11/2017   SpO2 98%   Breastfeeding Unknown   BMI 31.83 kg/m   General: NAD CV: RRR Pulm: CTABL, nl effort ABD: s/nd/nt, fundus firm and below the umbilicus Lochia: moderate Incision: c/d/i  DVT Evaluation: LE non-ttp, no evidence of DVT on exam.  Hemoglobin  Date Value Ref Range Status  05/23/2018 10.5 (L) 12.0 - 15.0 g/dL Final   HCT  Date Value Ref Range Status  05/23/2018 31.3 (L) 36.0 - 46.0 % Final    Post partum course: routine, by time of discharge she was  ambulating, voiding, tolerating her pain with PO meds, and tolerating regular diet Postpartum Procedures: none Disposition: stable, discharge to home. Baby Feeding: breastmilk Baby Disposition: home with mom  Rh Immune globulin given: n/a Rubella vaccine given: n/a Tdap vaccine given in AP or PP setting: AP Flu vaccine given in AP or PP setting: AP  Contraception: OCPs  Prenatal Labs: Blood type/Rh --/--/O POS (12/26 1725)  Antibody screen neg  Rubella Immune  Varicella Immune  RPR NR  HBsAg Neg  HIV NR  GC neg  Chlamydia neg  Genetic screening negative  1 hour GTT failed  3 hour GTT failed  GBS negative     Plan:  Kristie Champagnelaudia Plata Stevens was discharged to home in good condition. [facility] delivering provider in 2 weeks   Discharge Medications: Allergies as of 05/23/2018   No Known Allergies     Medication List    STOP taking these medications   cyclobenzaprine 5 MG tablet Commonly known as:  FLEXERIL   meloxicam 15 MG tablet Commonly known as:  MOBIC     TAKE these medications   acetaminophen 500 MG tablet Commonly known as:  TYLENOL Take 2 tablets (1,000 mg total) by mouth every 6 (six) hours.   ibuprofen 800 MG tablet Commonly known as:  ADVIL,MOTRIN Take 1 tablet (800 mg total) by mouth every 6 (six) hours.   oxyCODONE 5 MG immediate release tablet Commonly known as:  Oxy IR/ROXICODONE Take 1 tablet (5 mg total) by mouth every 4 (four) hours as needed for severe  pain or breakthrough pain.   PRENATAL 28-0.8 MG Tabs Take 1 tablet by mouth daily.       Follow-up Information    Christeen DouglasBeasley, Bethany, MD In 2 weeks.   Specialty:  Obstetrics and Gynecology Why:  For postop check Contact information: [address] [place] [address] [phone]           Signed: ----- Ranae Plumberhelsea Lanny Lipkin, MD Attending Obstetrician and Gynecologist [facility], Department of OB/GYN [facility]

## 2018-05-21 NOTE — Lactation Note (Signed)
This note was copied from a baby's chart. Lactation Consultation Note  Patient Name: Boy Kerrin ChampagneClaudia Plata Ornelas [id number]'s Date: 05/21/2018 Reason for consult: Initial assessment;1st time breastfeeding;Other (Comment)(mom GDM)   Maternal Data Formula Feeding for Exclusion: No Does the patient have breastfeeding experience prior to this delivery?: Yes  Breast fed older two children, youngest is 37 yr old, no problems Feeding Feeding Type: Breast Fed  LATCH Score Latch: Grasps breast easily, tongue down, lips flanged, rhythmical sucking.  Audible Swallowing: None  Type of Nipple: Everted at rest and after stimulation  Comfort (Breast/Nipple): Soft / non-tender(firm breast r/t breast aug, 2015, implant under muscle, peri)peri-areolar incision   Hold (Positioning): Assistance needed to correctly position infant at breast and maintain latch.  LATCH Score: 7  Interventions Interventions: Breast feeding basics reviewed;Assisted with latch;Skin to skin;Hand express;Adjust position;Support pillows  Lactation Tools Discussed/Used     Consult Status Consult Status: Follow-up Date: 05/21/18 Follow-up type: In-patient    Dyann KiefMarsha D Cortland Crehan 05/21/2018, 2:31 PM

## 2018-05-21 NOTE — Progress Notes (Signed)
Day of Surgery Procedure(s) with comments: CESAREAN SECTION (N/A) - Time of Birth: 11:22 Sex: Female Weight: 7 lb 14 oz  Subjective: Pain is poorly controlled. No SOB or CP. Resting quietly.   Objective: Vital signs in last 24 hours: Temp:  [98 F (36.7 C)-99.7 F (37.6 C)] 98.7 F (37.1 C) (12/27 1545) Pulse Rate:  [70-101] 87 (12/27 1515) Resp:  [9-19] 13 (12/27 1515) BP: (92-136)/(38-97) 105/68 (12/27 1515) SpO2:  [96 %-100 %] 98 % (12/27 1515) Weight:  [78.9 kg] 78.9 kg (12/26 1835)  Intake/Output  Intake/Output Summary (Last 24 hours) at 05/21/2018 1707 Last data filed at 05/21/2018 1532 Gross per 24 hour  Intake 6887.51 ml  Output 4300 ml  Net 2587.51 ml    Physical Exam:  General: Alert and oriented. CV: RRR Lungs: Clear bilaterally. GI: Soft, Nondistended. Dressing: Clean and dry. Urine: Clear Extremities: Nontender, no erythema, no edema.  Assessment/Plan: POD# 0 s/p Procedure(s) with comments: CESAREAN SECTION (N/A) - Time of Birth: 11:22 Sex: Female Weight: 7 lb 14 oz . ERAS postop excluding NSAIDs, with iv dilaudid for rescue  1) Encourage Incentive spirometry 2) Advance diet as tolerated, but may do clears overnight 3) Continue oral pain medication, with IV rescue as needed 4) OOB to chair for 4 hours this afternoon   Kristie DouglasBethany Kajuan Guyton, MD   LOS: 1 day   Kristie Stevens 05/21/2018, 5:07 PM

## 2018-05-21 NOTE — Anesthesia Procedure Notes (Signed)
Epidural Patient location during procedure: OB Start time: 05/20/2018 9:27 PM End time: 05/20/2018 9:40 PM  Staffing Anesthesiologist: Yves Dillarroll, Gwendloyn Forsee, MD Performed: anesthesiologist   Preanesthetic Checklist Completed: patient identified, site marked, surgical consent, pre-op evaluation, timeout performed, IV checked, risks and benefits discussed and monitors and equipment checked  Epidural Patient position: sitting Prep: Betadine Patient monitoring: heart rate, continuous pulse ox and blood pressure Approach: midline Location: L3-L4 Injection technique: LOR air  Needle:  Needle type: Tuohy  Needle gauge: 17 G Needle length: 9 cm and 9 Catheter type: closed end flexible Catheter size: 20 Guage Test dose: negative and 1.5% lidocaine with Epi 1:200 K  Assessment Events: blood not aspirated, injection not painful, no injection resistance, negative IV test and no paresthesia  Additional Notes Time out called.  Patient placed in sitting position.  Back prepped and draped in sterile fashion.  A skin wheal was made in the L3-L4 interspace with 1% lidocaine plain.  A 17G Tuohy needle was advanced into the epidural space by a loss of resistance technique.  The epidural catheter was threaded 3cm into the epidural space and the TD was negative.  No blood or paresthesias. The patient tolerated the procedure well and the catheter was affixed to the back in sterile fashion.Reason for block:procedure for pain

## 2018-05-21 NOTE — Op Note (Signed)
Cesarean Section Procedure Note  Date of procedure: 05/21/2018   Pre-operative Diagnosis: Intrauterine pregnancy at [redacted]w[redacted]d  - Prior C/S x2 - PROM x37 hrs - Failed TOLAC with failed induction - failure to dilate at 7 cm - Non reassuring fetal status  Post-operative Diagnosis: same, delivered plus significant adhesive disease, intrapartum hemorrhage due to adhesions, OP cephalic position, meconium-stained fluid  Procedure: Repeat Low Transverse Cesarean Section through Pfannenstiel incision; lysis of adhesions  Surgeon: BBenjaman Kindler MD  Assistant(s):  CST  Anesthesia: Epidural anesthesia  Anesthesiologist: No responsible provider has been recorded for the case. Anesthesiologist: AMolli Barrows MD; CAlvin Critchley MD CRNA: NNelda Marseille CRNA; PAllean Found CRNA  Estimated Blood Loss:  1500cc         Drains: none         Total IV Fluids: 16561m Urine Output: 50032m       Specimens: Cord blood for O positive; cord arterial gases attempted but not collected         Complications:   - significant adhesions between rectus muscle and uterine serosa, leading to significant bleeding - right sided adhesions prevented full evaluation of the right uterus, and the broad ligament was incorporated into the repair.  - the uterus remained in situ because of adhesions.         Disposition: PACU - hemodynamically stable.         Condition: stable  Findings:  A female infant "Diego" in OP cephalic  presentation. Amniotic fluid - Meconium  Birth weight 7#14oz.  Apgars of 9 and 9 at one and five minutes respectively.  Intact placenta with a three-vessel cord.  Grossly normal uterus, tubes and ovaries bilaterally. Dense and generalized intraabdominal adhesions were noted, R>L  Indications: failed induction, failure to progress: arrest of dilation, malpresentation: OP and non-reassuring fetal status  Procedure Details  The patient was taken to Operating Room, identified as the  correct patient and the procedure verified as C-Section Delivery. A formal Time Out was held with all team members present and in agreement.  After induction of anesthesia, the patient was draped and prepped in the usual sterile manner. A Pfannenstiel skin incision was made and carried down through the subcutaneous tissue to the fascia. Fascial incision was made and extended transversely with the Mayo scissors. The fascia was minimally separated from the underlying rectus tissue superiorly and inferiorly due to adhesions between fascia and rectus. The peritoneum was never identified but it was entered bluntly.   The blunt entry into the peritoneal cavity to try to avoid damage to intraperitoneal organs entered directly into the uterine muscle itself on the right. Significant bleeding was controlled with figure of 8 stiches prior to the hysterotomy. The omentum also was doubly clamped, cut and tied several times to separate it from the uterus.  A bladder flap was unable to be created digitally. The bladder blade was placed.  A low transverse hysterotomy was made. The fetus was delivered atraumatically with a loose nuchal cord and was promptly vigorous. Delayed cord clamping and stimulation on the field. The umbilical cord was clamped x2 and cut and the infant was handed to the awaiting pediatricians. The placenta was removed intact and appeared normal, intact, and with a 3-vessel cord. Cord gases were collected and arterial gas was attempted but unsuccessful.  The uterus was unable to be exteriorized and it was cleared of all clot and debris. The hysterotomy was closed with running sutures of 0-Vicryl. A second imbricating layer  was placed with the same suture for bleeding control.  Attention was turned to the adhesions and uterine muscle that was bleeding superiorly to the hysterotomy.  These areas were closed with layered running sutures, and a baseball stitch at the top.  Multiple figure-of-eight's  were used as well as pressure and cautery to ensure hemostasis.  The broad ligament on the right was incorporated into the stitch, as deep sinuses were noted in the uterine muscle where it met the broad.  The bowel and omentum were packed out of the way during this part of the procedure.  During this time the patient began to complain of pain, and was given propofol and IV fentanyl.  She was asleep through most of this part of the procedure.  We did take a significant amount of time to ensure hemostasis.  The hysterotomy itself was hemostatic from the beginning.  Adhesion barrier "Interceed" was placed over all of the raw areas.   The fascia was then reapproximated with running sutures of 0 Vicryl.  The subcutaneous tissue was reapproximated with running sutures of 0 Vicry. The skin was reapproximated with absorbable staples.   78m (in 30 of 0.5% bupivicaine and 58mof NSS) of liposomal bupivicaine placed in the fascial and skin lines.  Instrument, sponge, and needle counts were correct prior to the abdominal closure and at the conclusion of the case.   The patient tolerated the procedure as above and was transferred to the recovery room in stable condition.   I will repeat a CBC in 6 hours.  BeBenjaman KindlerMD 05/21/2018

## 2018-05-21 NOTE — Discharge Instructions (Signed)

## 2018-05-21 NOTE — Transfer of Care (Signed)
Immediate Anesthesia Transfer of Care Note  Patient: Kristie Stevens  Procedure(s) Performed: CESAREAN SECTION (N/A Abdomen)  Patient Location: PACU  Anesthesia Type:Epidural  Level of Consciousness: awake, alert  and oriented  Airway & Oxygen Therapy: Patient Spontanous Breathing and Patient connected to nasal cannula oxygen  Post-op Assessment: Report given to RN and Post -op Vital signs reviewed and stable  Post vital signs: Reviewed and stable  Last Vitals:  Vitals Value Taken Time  BP    Temp    Pulse    Resp    SpO2      Last Pain:  Vitals:   05/21/18 0945  TempSrc: Oral  PainSc: 0-No pain      Patients Stated Pain Goal: 0 (05/20/18 1835)  Complications: No apparent anesthesia complications

## 2018-05-21 NOTE — Progress Notes (Signed)
Late entry due to patient care:  At midnight I discussed with the patient and an interpreter the induction of labor plan, and we started low dose pitocin. I also ruptured a forebag for thick meconium. She has had contractions throughout, but were mild until pitocin started. She received an epidural. Her blood pressure dropped and she had late decels, with mod var and accels. However, they became recurrent and pitocin was stopped.   She has not changed her cervix from 6-7cm at midnight, though her cervix did efface during this time from 50 to 80%. The presenting vertex is still -2 station now with molding, no edema.  I discussed with the patient at 0700 this morning that her FHT were reassuring but remained Cat II with late decels whenever we restarted pitocin. She was very motivated to avoid repeat cesarean section, and we decided that in the setting of accels and moderate variability, we would recheck in 2 hours.   She has not made cervical change, and though the FHT are currently Cat I with accels, she is arrested in the first stage.  The risks of cesarean section discussed with the patient included but were not limited to: bleeding which may require transfusion or reoperation; infection which may require antibiotics; injury to bowel, bladder, ureters or other surrounding organs; injury to the fetus; need for additional procedures including hysterectomy in the event of a life-threatening hemorrhage; placental abnormalities wth subsequent pregnancies, incisional problems, thromboembolic phenomenon and other postoperative/anesthesia complications. The patient concurred with the proposed plan, giving informed written consent for the procedure.   Patient has been NPO since yesterday she will remain NPO for procedure. Anesthesia and OR aware. Preoperative prophylactic antibiotics and SCDs ordered on call to the OR.  To OR when ready.  Exparel, azithromycin, vaginal prep prior

## 2018-05-21 NOTE — Progress Notes (Signed)
Spanish interpreter utilized by Dr. Dalbert GarnetBeasley to discuss POC with patient

## 2018-05-22 LAB — CBC
HEMATOCRIT: 30.8 % — AB (ref 36.0–46.0)
Hemoglobin: 10.4 g/dL — ABNORMAL LOW (ref 12.0–15.0)
MCH: 32.6 pg (ref 26.0–34.0)
MCHC: 33.8 g/dL (ref 30.0–36.0)
MCV: 96.6 fL (ref 80.0–100.0)
Platelets: 197 10*3/uL (ref 150–400)
RBC: 3.19 MIL/uL — ABNORMAL LOW (ref 3.87–5.11)
RDW: 13.2 % (ref 11.5–15.5)
WBC: 12 10*3/uL — ABNORMAL HIGH (ref 4.0–10.5)
nRBC: 0 % (ref 0.0–0.2)

## 2018-05-22 LAB — RPR: RPR: NONREACTIVE

## 2018-05-22 MED ORDER — VITAMIN C 500 MG PO TABS
250.0000 mg | ORAL_TABLET | Freq: Two times a day (BID) | ORAL | Status: DC
  Administered 2018-05-23: 250 mg via ORAL
  Filled 2018-05-22: qty 1

## 2018-05-22 MED ORDER — FERROUS SULFATE 325 (65 FE) MG PO TABS
325.0000 mg | ORAL_TABLET | Freq: Two times a day (BID) | ORAL | Status: DC
  Administered 2018-05-23: 325 mg via ORAL
  Filled 2018-05-22: qty 1

## 2018-05-22 NOTE — Progress Notes (Signed)
Subjective: Postpartum Day 1: Cesarean Delivery Patient reports incisional pain, tolerating PO and + flatus.    Objective: Vital signs in last 24 hours: Temp:  [98 F (36.7 C)-98.7 F (37.1 C)] 98.6 F (37 C) (12/28 1155) Pulse Rate:  [70-96] 70 (12/28 1155) Resp:  [9-20] 20 (12/28 1155) BP: (88-127)/(47-97) 99/63 (12/28 1155) SpO2:  [96 %-100 %] 97 % (12/28 0736)  Physical Exam:  General: alert, cooperative and appears stated age Lochia: appropriate Uterine Fundus: firm Dressing: no significant drainage DVT Evaluation: No evidence of DVT seen on physical exam. Negative Homan's sign.  Recent Labs    05/21/18 1802 05/22/18 0555  HGB 11.1* 10.4*  HCT 32.4* 30.8*    Assessment/Plan: Status post Cesarean section. Doing well postoperatively.  Continue current care. Remove dressing today Acute blood loss anemia: PO iron and ascorbic acid.  Kristie Stevens 05/22/2018, 12:10 PM

## 2018-05-22 NOTE — Anesthesia Postprocedure Evaluation (Signed)
Anesthesia Post Note  Patient: Kristie Stevens  Procedure(s) Performed: CESAREAN SECTION (N/A Abdomen)  Patient location during evaluation: PACU Anesthesia Type: Epidural Level of consciousness: oriented and awake and alert Pain management: pain level controlled Vital Signs Assessment: post-procedure vital signs reviewed and stable Respiratory status: spontaneous breathing, respiratory function stable and patient connected to nasal cannula oxygen Cardiovascular status: blood pressure returned to baseline and stable Postop Assessment: no headache, no backache and no apparent nausea or vomiting Anesthetic complications: no     Last Vitals:  Vitals:   05/22/18 0736 05/22/18 1155  BP: 105/60 99/63  Pulse: 73 70  Resp: 18 20  Temp: 36.7 C 37 C  SpO2: 97%     Last Pain:  Vitals:   05/22/18 1300  TempSrc:   PainSc: 4                  Kristie Stevens S

## 2018-05-22 NOTE — Lactation Note (Addendum)
This note was copied from a baby's chart. Lactation Consultation Note  Patient Name: Kristie Stevens [id number]'s Date: 05/22/2018 Reason for consult: Initial assessment   Maternal Data    Feeding Feeding Type: Breast Fed  LATCH Score                   Interventions    Lactation Tools Discussed/Used     Consult Status  Mother states that breastfeeding has been going well with no pain or discomfort for mom. Mother states that infant has been having sufficient wet and dirty diapers. Mother denies any concerns at this time.  LC assisted with latch and positioning of infant. Mother states that she feels infant is not getting enough at the breast because infant is still fussy each time after breastfeeding. Infant was given 10 mL of formula due to increased bilirubin level requiring phototherapy. Mother initiated pumping and was encouraged to pump every 2-3 hours for 15 minutes.    Arlyss Gandylicia Nekeshia Lenhardt 05/22/2018, 10:51 AM

## 2018-05-23 LAB — CBC
HCT: 31.3 % — ABNORMAL LOW (ref 36.0–46.0)
Hemoglobin: 10.5 g/dL — ABNORMAL LOW (ref 12.0–15.0)
MCH: 32.7 pg (ref 26.0–34.0)
MCHC: 33.5 g/dL (ref 30.0–36.0)
MCV: 97.5 fL (ref 80.0–100.0)
Platelets: 251 10*3/uL (ref 150–400)
RBC: 3.21 MIL/uL — ABNORMAL LOW (ref 3.87–5.11)
RDW: 13.3 % (ref 11.5–15.5)
WBC: 12.7 10*3/uL — AB (ref 4.0–10.5)
nRBC: 0 % (ref 0.0–0.2)

## 2018-05-23 MED ORDER — IBUPROFEN 800 MG PO TABS: 800.0000 mg | ORAL_TABLET | Freq: Four times a day (QID) | ORAL | 1 refills | Status: DC

## 2018-05-23 MED ORDER — ACETAMINOPHEN 500 MG PO TABS: 1000.0000 mg | ORAL_TABLET | Freq: Four times a day (QID) | ORAL | 1 refills | Status: DC

## 2018-05-23 MED ORDER — OXYCODONE HCL 5 MG PO TABS: 5.0000 mg | ORAL_TABLET | ORAL | 0 refills | Status: DC | PRN

## 2018-05-23 NOTE — Progress Notes (Signed)
Discharge instructions given. Patient verbalizes understanding of teaching. Patient discharged home via wheelchair at 1630. 

## 2018-05-23 NOTE — Lactation Note (Signed)
This note was copied from a baby's chart. Lactation Consultation Note  Patient Name: Boy Kerrin ChampagneClaudia Plata Ornelas Today's Date: 05/23/2018     Maternal Data    Feeding    LATCH Score                   Interventions    Lactation Tools Discussed/Used     Consult Status  LC did not observe a feeding, but spoke with parents about how breastfeeding is going. Mom stated that she thinks the baby does "ok" on the breast, but does better with bottles. LC explained how bottles are easier for babies to use, but to continue with nursing as baby shows cues. Transferring milk hasn't been an issue with exclusive nursing because baby still had output WDL. LC explained the physiology of establishing and maintaining a milk supply. Parents feel more comfortable doing breast/bottle combo feeds and LC supports decision to do so. Parents know they can return for outpatient consultation after d/c if needed.     Burnadette PeterJaniya M Demorio Seeley 05/23/2018, 12:01 PM

## 2018-05-24 ENCOUNTER — Inpatient Hospital Stay: Admit: 2018-05-24 | Payer: Medicaid Other | Admitting: Obstetrics and Gynecology

## 2018-05-24 ENCOUNTER — Encounter: Payer: Self-pay | Admitting: Obstetrics and Gynecology

## 2018-05-24 SURGERY — Surgical Case
Anesthesia: Spinal

## 2018-06-30 ENCOUNTER — Emergency Department
Admission: EM | Admit: 2018-06-30 | Discharge: 2018-07-01 | Disposition: A | Discharge status: Home / Self Care | Payer: Medicaid Other | Attending: Emergency Medicine | Admitting: Emergency Medicine

## 2018-06-30 ENCOUNTER — Other Ambulatory Visit: Payer: Self-pay

## 2018-06-30 ENCOUNTER — Encounter: Payer: Self-pay | Admitting: Emergency Medicine

## 2018-06-30 DIAGNOSIS — R1032 Left lower quadrant pain: Secondary | ICD-10-CM | POA: Diagnosis present

## 2018-06-30 DIAGNOSIS — K6389 Other specified diseases of intestine: Secondary | ICD-10-CM

## 2018-06-30 DIAGNOSIS — K529 Noninfective gastroenteritis and colitis, unspecified: Secondary | ICD-10-CM | POA: Insufficient documentation

## 2018-06-30 LAB — COMPREHENSIVE METABOLIC PANEL
ALT: 24 U/L (ref 0–44)
AST: 21 U/L (ref 15–41)
Albumin: 3.9 g/dL (ref 3.5–5.0)
Alkaline Phosphatase: 90 U/L (ref 38–126)
Anion gap: 4 — ABNORMAL LOW (ref 5–15)
BUN: 12 mg/dL (ref 6–20)
CHLORIDE: 107 mmol/L (ref 98–111)
CO2: 27 mmol/L (ref 22–32)
Calcium: 8.4 mg/dL — ABNORMAL LOW (ref 8.9–10.3)
Creatinine, Ser: 0.54 mg/dL (ref 0.44–1.00)
GFR calc Af Amer: >60 mL/min (ref >60)
GFR calc non Af Amer: >60 mL/min (ref >60)
Glucose, Bld: 90 mg/dL (ref 70–99)
Potassium: 3.8 mmol/L (ref 3.5–5.1)
SODIUM: 138 mmol/L (ref 135–145)
Total Bilirubin: 0.5 mg/dL (ref 0.3–1.2)
Total Protein: 7 g/dL (ref 6.5–8.1)

## 2018-06-30 LAB — CBC
HCT: 38.1 % (ref 36.0–46.0)
Hemoglobin: 12.8 g/dL (ref 12.0–15.0)
MCH: 30.9 pg (ref 26.0–34.0)
MCHC: 33.6 g/dL (ref 30.0–36.0)
MCV: 92 fL (ref 80.0–100.0)
Platelets: 260 10*3/uL (ref 150–400)
RBC: 4.14 MIL/uL (ref 3.87–5.11)
RDW: 11.9 % (ref 11.5–15.5)
WBC: 7.7 10*3/uL (ref 4.0–10.5)
nRBC: 0 % (ref 0.0–0.2)

## 2018-06-30 LAB — LIPASE, BLOOD: LIPASE: 29 U/L (ref 11–51)

## 2018-06-30 MED ORDER — IOPAMIDOL (ISOVUE-300) INJECTION 61%
30.0000 mL | Freq: Once | INTRAVENOUS | Status: AC
  Administered 2018-06-30: 30 mL via ORAL

## 2018-06-30 NOTE — ED Triage Notes (Signed)
Pt presents to ED with left lower abd pain. No hx of the same. Sudden onset of pain this afternoon. Denies nausea, vomiting, or diarrhea. c-section 5 weeks ago. Tender to touch.

## 2018-06-30 NOTE — ED Provider Notes (Signed)
[facility] Emergency Department Provider Note  ____________________________________________   First MD Initiated Contact with Patient 06/30/18 2255     (approximate)  I have reviewed the triage vital signs and the nursing notes.   HISTORY  Chief Complaint Abdominal Pain    HPI Kristie Stevens is a 38 y.o. female who is about 5-weeks out from C-section (by Dr. Dalbert GarnetBeasley) who presents for evaluation of acute onset severe sharp pulsating left lower quadrant abdominal pain.  She says that she has been in her normal state of health since the surgery and has had no problems at all.  Yesterday she felt like her abdomen was full or swollen but there was no pain.  The pain started acutely today and is gradually gotten worse over the course the day.  It is not accompanied by any other symptoms.  Specifically she denies fever/chills, chest pain or shortness of breath, nausea, vomiting, diarrhea, constipation, vaginal bleeding, and burning while urinating.  The left lower quadrant abdominal pain is severe and worse with any kind of pressure, worse with deep breaths, and worse with movement.  Rest makes it a little bit better.  She has never had this kind of pain before.  She has had an appetite today and reports eating well.   History reviewed. No pertinent past medical history.  Patient Active Problem List   Diagnosis Date Noted  . Full-term premature rupture of membranes 05/20/2018    Past Surgical History:  Procedure Laterality Date  . AUGMENTATION MAMMAPLASTY Bilateral 2015   gel implants  . CESAREAN SECTION    . CESAREAN SECTION N/A 05/21/2018   Procedure: CESAREAN SECTION;  Surgeon: Christeen DouglasBeasley, Bethany, MD;  Location: [facility] ORS;  Service: Obstetrics;  Laterality: N/A;  Time of Birth: [birth year] Sex: Female Weight: 7 lb 14 oz     Prior to Admission medications   Medication Sig Start Date End Date Taking? Authorizing Provider  acetaminophen (TYLENOL) 500 MG  tablet Take 2 tablets (1,000 mg total) by mouth every 6 (six) hours. 05/23/18   Ward, Elenora Fenderhelsea C, MD  ibuprofen (ADVIL,MOTRIN) 600 MG tablet Take 1 tablet by mouth three times daily with meals 07/01/18   Loleta RoseForbach, Kolston Lacount, MD  oxyCODONE (OXY IR/ROXICODONE) 5 MG immediate release tablet Take 1 tablet (5 mg total) by mouth every 4 (four) hours as needed for severe pain or breakthrough pain. 05/23/18   Ward, Elenora Fenderhelsea C, MD  oxyCODONE-acetaminophen (PERCOCET) 5-325 MG tablet Take 2 tablets by mouth every 6 (six) hours as needed for severe pain. 07/01/18   Loleta RoseForbach, Alexas Basulto, MD  PRENATAL 28-0.8 MG TABS Take 1 tablet by mouth daily.    [provider]    Allergies Patient has no known allergies.  Family History  Problem Relation Age of Onset  . Hyperlipidemia Father   . Hyperlipidemia Brother   . Diabetes Paternal Grandmother     Social History Social History   Tobacco Use  . Smoking status: Never Smoker  . Smokeless tobacco: Never Used  Substance Use Topics  . Alcohol use: Never    Alcohol/week: 0.0 standard drinks    Frequency: Never    Comment: occ  . Drug use: No    Review of Systems Constitutional: No fever/chills Eyes: No visual changes. ENT: No sore throat. Cardiovascular: Denies chest pain. Respiratory: Denies shortness of breath. Gastrointestinal: Left lower quadrant pulsatile sharp abdominal pain as described above.  Denies nausea, vomiting, diarrhea. Genitourinary: 5 weeks status post cesarean section.  Negative for dysuria.  Negative for vaginal bleeding. Musculoskeletal: Negative for neck pain.  Negative for back pain. Integumentary: Negative for rash. Neurological: Negative for headaches, focal weakness or numbness.   ____________________________________________   PHYSICAL EXAM:  VITAL SIGNS: ED Triage Vitals  Enc Vitals Group     BP 06/30/18 2003 125/60     Pulse Rate 06/30/18 2003 68     Resp 06/30/18 2003 20     Temp 06/30/18 2003 97.9 F (36.6 C)      Temp Source 06/30/18 2003 Oral     SpO2 06/30/18 2003 100 %     Weight 06/30/18 2004 70.3 kg (155 lb)     Height 06/30/18 2004 1.575 m (5\' 2" )     Head Circumference --      Peak Flow --      Pain Score 06/30/18 2004 8     Pain Loc --      Pain Edu? --      Excl. in GC? --     Constitutional: Alert and oriented.  Appears uncomfortable and anxious but otherwise well-appearing. Eyes: Conjunctivae are normal.  Head: Atraumatic. Nose: No congestion/rhinnorhea. Mouth/Throat: Mucous membranes are moist. Neck: No stridor.  No meningeal signs.   Cardiovascular: Normal rate, regular rhythm. Good peripheral circulation. Grossly normal heart sounds. Respiratory: Normal respiratory effort.  No retractions. Lungs CTAB. Gastrointestinal: Soft and nondistended.  Minimal tenderness to palpation throughout the abdomen but in the left lower quadrant she is severely tender with rebound and guarding.  No palpable pulsatile masses.  Of note she also experienced a great deal of tenderness simply by me bumping into the bed to reclining backwards to place her down in a supine position.  This is consistent with localized peritonitis. Musculoskeletal: No lower extremity tenderness nor edema. No gross deformities of extremities. Neurologic:  Normal speech and language. No gross focal neurologic deficits are appreciated.  Skin:  Skin is warm, dry and intact. No rash noted. Psychiatric: Mood and affect are normal. Speech and behavior are normal.  ____________________________________________   LABS (all labs ordered are listed, but only abnormal results are displayed)  Labs Reviewed  COMPREHENSIVE METABOLIC PANEL - Abnormal; Notable for the following components:      Result Value   Calcium 8.4 (*)    Anion gap 4 (*)    All other components within normal limits  LIPASE, BLOOD  CBC  URINALYSIS, COMPLETE (UACMP) WITH MICROSCOPIC  POC URINE PREG, ED    ____________________________________________  EKG  None - EKG not ordered by ED physician ____________________________________________  RADIOLOGY   ED MD interpretation: CT scan results are consistent with epiploic appendagitis as per the radiology report  Official radiology report(s): Ct Abdomen Pelvis W Contrast  Result Date: 07/01/2018 CLINICAL DATA:  Left lower abdominal pain since this afternoon. C-section 5 weeks ago. EXAM: CT ABDOMEN AND PELVIS WITH CONTRAST TECHNIQUE: Multidetector CT imaging of the abdomen and pelvis was performed using the standard protocol following bolus administration of intravenous contrast. CONTRAST:  ISOVUE-300 IOPAMIDOL (ISOVUE-300) INJECTION 61% COMPARISON:  10/08/2016 FINDINGS: Lower chest: Mild dependent changes in the lung bases. Bilateral breast implants. Hepatobiliary: No focal liver abnormality is seen. No gallstones, gallbladder wall thickening, or biliary dilatation. Pancreas: Unremarkable. No pancreatic ductal dilatation or surrounding inflammatory changes. Spleen: Normal in size without focal abnormality. Adrenals/Urinary Tract: Adrenal glands are unremarkable. Kidneys are normal, without renal calculi, focal lesion, or hydronephrosis. Bladder is unremarkable. Stomach/Bowel: Stomach, small bowel, and colon are not abnormally distended. Stool fills the colon. No  colonic wall thickening. There is infiltration and edema surrounding a fat lobule at the junction of the descending and sigmoid colon. The appearance is most consistent with epiploic appendagitis. Appendix is normal. Vascular/Lymphatic: No significant vascular findings are present. No enlarged abdominal or pelvic lymph nodes. Reproductive: Uterus is anteverted. Uterus is mildly enlarged consistent with postpartum state. Low-attenuation changes in the anterior lower uterine segment myometrium consistent with postoperative changes related to recent C-section. Ovaries are not enlarged.  Infiltration in the fat anterior to the uterus and low pelvic subcutaneous fat, likely postoperative. No loculated collections. Other: No free air in the abdomen. Abdominal wall musculature appears intact. Musculoskeletal: No acute or significant osseous findings. IMPRESSION: 1. Infiltration and edema surrounding a fat lobule at the junction of the descending and sigmoid colon consistent with epiploic appendagitis. 2. Expected postoperative changes in the uterus and pelvis. Electronically Signed   By: Burman Nieves M.D.   On: 07/01/2018 01:33    ____________________________________________   PROCEDURES  Critical Care performed: No   Procedure(s) performed:   Procedures   ____________________________________________   INITIAL IMPRESSION / ASSESSMENT AND PLAN / ED COURSE  As part of my medical decision making, I reviewed the following data within the electronic MEDICAL RECORD NUMBER Nursing notes reviewed and incorporated, Labs reviewed , Old chart reviewed, Notes from prior ED visits and [place] Controlled Substance Database    Differential diagnosis includes, but is not limited to, postoperative infection such as an abdominal abscess, ovarian torsion/cyst, musculoskeletal pain/strain, diverticulitis, less likely aortic dissection.  The location of the pain suggests an abdominal wall or intestinal issue but a left adnexal issue is also possible.  PID/STD is less likely given her recent pregnancy, C-section, and neonatal care she has been providing.  Given the degree of pain she is experiencing I will give her morphine 4 mg IV, Zofran 4 mg IV, and will proceed with a CT scan of the abdomen and pelvis with oral and IV contrast for optimal evaluation of her intestines as well as the rest of her abdominal and pelvic structures.  If she needs a pelvic ultrasound based on a large ovarian cyst or mass, for example, we will proceed, but I think that an intra-abdominal cause is more likely at this time than  an adnexal cause, and delaying the CT scan would be potentially detrimental to her.  She understands and agrees with the plan.  No indication for emergent pelvic exam at this time.   Clinical Course as of Jul 01 209  Thu Jul 01, 2018  0207 CT scan is consistent with epiploic appendagitis.  No evidence of acute infection.  I explained to the patient that this is an inflammation that should improve over time but I am giving information about surgical follow-up for next week if she is not improving.  I gave my usual and customary return precautions.   CT ABDOMEN PELVIS W CONTRAST [CF]  0208 She is getting Toradol 15 mg IV as well as morphine 4 mg IV and Zofran 4 mg IV.  I am writing her prescription for Percocet but encouraged use of NSAIDs.  I also counseled her that she should not breast-feed immediately after taking the medication.   [CF]    Clinical Course User Index [CF] Loleta Rose, MD    ____________________________________________  FINAL CLINICAL IMPRESSION(S) / ED DIAGNOSES  Final diagnoses:  Epiploic appendagitis     MEDICATIONS GIVEN DURING THIS VISIT:  Medications  morphine 4 MG/ML injection 4 mg (has no  administration in time range)  ondansetron (ZOFRAN) injection 4 mg (has no administration in time range)  ketorolac (TORADOL) 30 MG/ML injection 15 mg (has no administration in time range)  iopamidol (ISOVUE-300) 61 % injection 30 mL (30 mLs Oral Contrast Given 06/30/18 2317)  iopamidol (ISOVUE-300) 61 % injection 100 mL (100 mLs Intravenous Contrast Given 07/01/18 0111)     ED Discharge Orders         Ordered    oxyCODONE-acetaminophen (PERCOCET) 5-325 MG tablet  Every 6 hours PRN     07/01/18 0210    ibuprofen (ADVIL,MOTRIN) 600 MG tablet     07/01/18 0210           Note:  This document was prepared using Dragon voice recognition software and may include unintentional dictation errors.   Loleta Rose, MD 07/01/18 [phone]

## 2018-07-01 ENCOUNTER — Emergency Department: Payer: Medicaid Other

## 2018-07-01 ENCOUNTER — Encounter: Payer: Self-pay | Admitting: Radiology

## 2018-07-01 MED ORDER — MORPHINE SULFATE (PF) 4 MG/ML IV SOLN
4.0000 mg | Freq: Once | INTRAVENOUS | Status: AC
  Administered 2018-07-01: 4 mg via INTRAVENOUS
  Filled 2018-07-01: qty 1

## 2018-07-01 MED ORDER — IBUPROFEN 600 MG PO TABS: ORAL_TABLET | ORAL | 0 refills | Status: DC

## 2018-07-01 MED ORDER — ONDANSETRON HCL 4 MG/2ML IJ SOLN
4.0000 mg | INTRAMUSCULAR | Status: AC
  Administered 2018-07-01: 4 mg via INTRAVENOUS
  Filled 2018-07-01: qty 2

## 2018-07-01 MED ORDER — IOPAMIDOL (ISOVUE-300) INJECTION 61%
100.0000 mL | Freq: Once | INTRAVENOUS | Status: AC | PRN
  Administered 2018-07-01: 100 mL via INTRAVENOUS

## 2018-07-01 MED ORDER — OXYCODONE-ACETAMINOPHEN 5-325 MG PO TABS: 2.0000 | ORAL_TABLET | Freq: Four times a day (QID) | ORAL | 0 refills | Status: DC | PRN

## 2018-07-01 MED ORDER — KETOROLAC TROMETHAMINE 30 MG/ML IJ SOLN
15.0000 mg | Freq: Once | INTRAMUSCULAR | Status: AC
  Administered 2018-07-01: 15 mg via INTRAVENOUS
  Filled 2018-07-01: qty 1

## 2018-07-12 ENCOUNTER — Other Ambulatory Visit: Payer: Self-pay

## 2018-07-12 ENCOUNTER — Ambulatory Visit (INDEPENDENT_AMBULATORY_CARE_PROVIDER_SITE_OTHER): Payer: Medicaid Other | Admitting: Surgery

## 2018-07-12 ENCOUNTER — Encounter: Payer: Self-pay | Admitting: Surgery

## 2018-07-12 VITALS — BP 105/72 | HR 73 | Temp 97.2°F | Ht 62.0 in | Wt 156.0 lb

## 2018-07-12 DIAGNOSIS — K529 Noninfective gastroenteritis and colitis, unspecified: Secondary | ICD-10-CM | POA: Diagnosis not present

## 2018-07-12 DIAGNOSIS — K6389 Other specified diseases of intestine: Secondary | ICD-10-CM

## 2018-07-12 NOTE — Patient Instructions (Addendum)
Patient will need to return to the office as needed.  Call the office with any questions or concerns.   Apendagitis epiploica Epiploic Appendagitis  La apendagitis epiploica (AE) es la hinchazn e irritacin de las bolsas (apndices epiploicos) adheridas al extremo del intestino grueso (colon). Estas bolsas contienen grasa y estn adheridas a Hydrographic surveyor exterior del colon. Esta afeccin provoca un dolor repentino del abdomen inferior. Si bien es infrecuente, la AE generalmente desaparece sola. Puede parecerse a otras afecciones del abdomen (abdominales), como apendicitis, un ataque de vescula biliar (colecistitis), o diverticulitis. Cules son las causas? Esta afeccin puede ser causada por lo siguiente:  Flujo sanguneo obstruido debido a una cogulo de [place].  Retorsin (torsin) de los apndices epiploicos.  Afecciones que provocan hinchazn e inflamacin del tejido circundante, como diarrea a largo plazo (crnica), enfermedad de Crohn o colitis ulcerosa. Qu incrementa el riesgo? Es ms probable que desarrolle esta afeccin si:  Tiene entre 40 y 72 aos.  Es hombre.  Tiene sobrepeso. Cules son los signos o sntomas? El sntoma ms comn de esta afeccin es dolor en la regin abdominal inferior que comienza repentinamente y puede ser muy intenso. El dolor puede experimentarse en cualquier parte de la regin abdominal inferior, pero es ms comn en el lado izquierdo. El dolor puede agravarse con el movimiento o cuando se hace presin sobre el abdomen. Los siguientes sntomas son posibles, pero no son comunes:  Grant Ruts.  Nuseas.  Diarrea o estreimiento. Cmo se diagnostica? Su mdico puede sospechar que usted tiene AE si tiene [employer] regin inferior del abdomen sin otros sntomas. Esta afeccin se puede diagnosticar en funcin de lo siguiente:  Exploracin por tomografa computarizada (TC). Esta es la mejor forma de diagnosticar la AE.  Sus sntomas y un examen  fsico.  Hydrologist.  Anlisis de sangre (recuento sanguneo completo, [facility]).  Un procedimiento para explorar dentro del abdomen usando un laparoscopio iluminado que tiene una cmara diminuta en el extremo (laparoscopa). Se puede realizar para Optometrist. Cmo se trata? La AE generalmente desaparece sin tratamiento. El mdico puede [employer] antiinflamatorios no esteroideos (AINE) (como aspirina o ibuprofeno) para reducir Chief Technology Officer y la inflamacin. En casos infrecuentes, si la afeccin no mejora o si regresa, puede necesitar ciruga para extraer los apndices. Siga estas indicaciones en su casa:  Tome los medicamentos de venta libre y los recetados solamente como se lo haya indicado el mdico.  Retome sus actividades normales como se lo haya indicado el mdico. Pregntele al mdico qu actividades son seguras para usted.  Concurra a todas las visitas de control como se lo haya indicado el mdico. Esto es importante. Comunquese con un mdico si:  Tiene fiebre.  Tiene nuseas, vmitos diarrea o estreimiento.  El dolor [place].  El dolor dura ms de [address]. Solicite ayuda de inmediato si:  Tiene un dolor intenso que no mejora luego de Teacher, adult education. Resumen  La apendagitis epiploica (AE) es la hinchazn e irritacin de las bolsas (apndices epiploicos) adheridas a Hydrographic surveyor exterior del colon. El colon es el extremo del intestino grueso.  La AE puede parecerse a otras afecciones abdominales, como apendicitis, un ataque de vescula biliar (colecistitis) o diverticulitis.  La AE generalmente desaparece sin tratamiento. El mdico puede recomendar los AINE (como aspirina o ibuprofeno) para reducir Chief Technology Officer y la inflamacin. Esta informacin no tiene Theme park manager el consejo del mdico. Asegrese de hacerle al mdico cualquier pregunta que tenga. Document Released: 03/09/2017 Document Revised: 03/09/2017  Document Reviewed: 03/09/2017 Elsevier Interactive  Patient Education  [employer].

## 2018-07-12 NOTE — Progress Notes (Signed)
Patient ID: Kristie Stevens, female   DOB: 1980-07-26, 38 y.o.   MRN: [id number]  HPI Kristie Stevens is a 38 y.o. female a recent episode of left lower quadrant abdominal pain.  She reported that this happened about 10 days ago or so and made her go to the emergency room.  She reported that the pain was severe at that time but now the pain has completely subsided.  No fevers no chills.  He is tolerating p.o. well.  A CT scan was personally reviewed showing evidence of epiploic appendagitis.  No evidence of an abscess.  ABC and CMP were normal.  She is able to perform more than 4 METS of activity without any shortness of breath or chest pain.  Denies any weight loss.  She has recovered well from her C-section  HPI  Past Medical History:  Diagnosis Date  . Anxiety   . Gestational diabetes   . Miscarriage     Past Surgical History:  Procedure Laterality Date  . AUGMENTATION MAMMAPLASTY Bilateral 2015   gel implants  . CESAREAN SECTION    . CESAREAN SECTION N/A 05/21/2018   Procedure: CESAREAN SECTION;  Surgeon: Christeen Douglas, MD;  Location: [facility] ORS;  Service: Obstetrics;  Laterality: N/A;  Time of Birth: 11:[birth year] Sex: Female Weight: 7 lb 14 oz   . DILATION AND CURETTAGE OF UTERUS      Family History  Problem Relation Age of Onset  . Hyperlipidemia Father   . Hyperlipidemia Brother   . Diabetes Paternal Grandmother     Social History Social History   Tobacco Use  . Smoking status: Never Smoker  . Smokeless tobacco: Never Used  Substance Use Topics  . Alcohol use: Never    Alcohol/week: 0.0 standard drinks    Frequency: Never    Comment: occ  . Drug use: No    No Known Allergies  Current Outpatient Medications  Medication Sig Dispense Refill  . acetaminophen (TYLENOL) 500 MG tablet Take 2 tablets (1,000 mg total) by mouth every 6 (six) hours. 65 tablet 1  . ibuprofen (ADVIL,MOTRIN) 600 MG tablet Take 1 tablet by mouth three times daily with meals 21 tablet 0   . oxyCODONE-acetaminophen (PERCOCET) 5-325 MG tablet Take 2 tablets by mouth every 6 (six) hours as needed for severe pain. 16 tablet 0  . PRENATAL 28-0.8 MG TABS Take 1 tablet by mouth daily.     No current facility-administered medications for this visit.      Review of Systems Full ROS  was asked and was negative except for the information on the HPI  Physical Exam Blood pressure 105/72, pulse 73, temperature (!) 97.2 F (36.2 C), temperature source Temporal, height 5\' 2"  (1.575 m), weight 156 lb (70.8 kg), SpO2 97 %, unknown if currently breastfeeding. CONSTITUTIONAL: NAD EYES: Pupils are equal, round, and reactive to light, Sclera are non-icteric. EARS, NOSE, MOUTH AND THROAT: The oropharynx is clear. The oral mucosa is pink and moist. Hearing is intact to voice. LYMPH NODES:  Lymph nodes in the neck are normal. RESPIRATORY:  Lungs are clear. There is normal respiratory effort, with equal breath sounds bilaterally, and without pathologic use of accessory muscles. CARDIOVASCULAR: Heart is regular without murmurs, gallops, or rubs. GI: The abdomen is soft, nontender, and nondistended. There are no palpable masses. There is no hepatosplenomegaly. There are normal bowel sounds in all quadrants.  Well-healed Pfannenstiel scar GU: Rectal deferred.   MUSCULOSKELETAL: Normal muscle strength and tone. No cyanosis or edema.  SKIN: Turgor is good and there are no pathologic skin lesions or ulcers. NEUROLOGIC: Motor and sensation is grossly normal. Cranial nerves are grossly intact. PSYCH:  Oriented to person, place and time. Affect is normal.  Data Reviewed  I have personally reviewed the patient's imaging, laboratory findings and medical records.    Assessment/Plan 38 year old female with a history of epiploic appendagitis.  Discussed with the patient the benign nature of her disease.  Her symptoms have completely resolved.  No further work-up or surgical intervention required.  I will  follow her up on a as needed basis.  Sterling Big, MD FACS General Surgeon 07/12/2018, 2:46 PM

## 2018-12-30 LAB — OB RESULTS CONSOLE HEPATITIS B SURFACE ANTIGEN: Hepatitis B Surface Ag: NEGATIVE

## 2019-01-17 DIAGNOSIS — Z3490 Encounter for supervision of normal pregnancy, unspecified, unspecified trimester: Secondary | ICD-10-CM | POA: Insufficient documentation

## 2019-01-19 LAB — OB RESULTS CONSOLE RUBELLA ANTIBODY, IGM: Rubella: IMMUNE

## 2019-01-19 LAB — OB RESULTS CONSOLE VARICELLA ZOSTER ANTIBODY, IGG: Varicella: IMMUNE

## 2019-02-28 ENCOUNTER — Encounter: Payer: Medicaid Other | Admitting: *Deleted

## 2019-02-28 ENCOUNTER — Other Ambulatory Visit: Payer: Self-pay

## 2019-02-28 ENCOUNTER — Encounter: Payer: Self-pay | Admitting: *Deleted

## 2019-02-28 NOTE — Progress Notes (Signed)
Patient came to Gestational class today but reported she just attended this program last year. She watched the video last year and still has her meter to check blood sugars. She will come back October 14 for an appointment with the dietitian and focus on nutrition. Provided Gestational Meal Planning Guidelines, Simple Meal Plan, 3 Day Food Record and extra BG records because she couldn't recall if she still had these handouts. Instructed her to bring food record and BG records to next appointment.

## 2019-03-14 ENCOUNTER — Ambulatory Visit: Payer: Medicaid Other | Admitting: Dietician

## 2019-03-15 ENCOUNTER — Telehealth: Payer: Self-pay | Admitting: Licensed Clinical Social Worker

## 2019-03-15 NOTE — Telephone Encounter (Signed)
-----   Message from Shawnee Knapp, RN sent at 03/15/2019  2:49 PM EDT ----- Regarding: referral [name] you are doing well!   Can you please follow-up with this patient about meeting with you and starting therapy?   This member is pregnant, has 2 children in the home and notes a hx of anxiety and panic attacks that has been well controlled until the last couple of weeks. Member saw OB today and was prescribed medication today for her anxiety. Not sure what med. No reported self harm just overwhelmed. She has the number for [employer].   Speaks spanish- [phone].   Thank you, Kristie Stevens

## 2019-04-06 ENCOUNTER — Encounter: Payer: Self-pay | Admitting: Dietician

## 2019-04-06 NOTE — Progress Notes (Signed)
Have not heard back from patient to reschedule her missed appointment from 03/14/19. Sent notification to referring provider.

## 2019-04-07 ENCOUNTER — Ambulatory Visit: Payer: Self-pay | Admitting: Licensed Clinical Social Worker

## 2019-04-28 ENCOUNTER — Ambulatory Visit: Payer: Self-pay | Admitting: Licensed Clinical Social Worker

## 2019-05-12 ENCOUNTER — Ambulatory Visit: Payer: Medicaid Other | Admitting: Licensed Clinical Social Worker

## 2019-05-12 ENCOUNTER — Other Ambulatory Visit: Payer: Self-pay

## 2019-05-12 ENCOUNTER — Encounter: Payer: Self-pay | Admitting: Licensed Clinical Social Worker

## 2019-05-12 DIAGNOSIS — F411 Generalized anxiety disorder: Secondary | ICD-10-CM | POA: Insufficient documentation

## 2019-05-12 NOTE — Progress Notes (Signed)
Counselor Initial Adult Exam  Name: Kristie Stevens Date: 05/12/2019 MRN: [id number] DOB: 1980-12-27 PCP: [facility]  Time spent: 1 hour   A biopsychosocial was completed on the Patient. Background information and current concerns were obtained during an intake in the office with the [facility] Department clinician, Leanna Battles, LCSW.  Contact information and confidentiality was discussed and appropriate consents were signed.     Reason for Visit /Presenting Problem: Patient was referred due to panic attacks. Patient reports concerns of anxiety and panic attacks. She reports that she has experienced panic attacks for many years, but they were infrequent and they have been occurring more frequently - last one occurred one month ago. Patient is currently prescribed Buspar and reports benefits, including helping with her sleep. Patient reports significant worry regarding having another C-section. She reports that her last one was traumatic- she lost a lot of blood, almost needed a blood transfusion, and her recovery took longer than she expected. Overall patient reports low levels of anxiety (GAD-7 = 7), but does report feeling anxious and overwhelmed at times. She reports that she is currently not with her partner of 3 years - the father of her 3 month old and the baby she is expecting in early February. She shares that she does have supportive family relationships and receives financial support from her mom and her 81 year old son. She also shares that having to stay in the house so much (due to Covid) has been difficult.  s well.   Mental Status Exam:   Appearance:   Casual     Behavior:  Appropriate, Minimizing and appears guarded   Motor:  Normal  Speech/Language:   Normal Rate  Affect:  Appropriate  Mood:  normal  Thought process:  normal  Thought content:    WNL  Sensory/Perceptual disturbances:    WNL  Orientation:  oriented to person, place, time/date  and situation  Attention:  Good  Concentration:  Good  Memory:  WNL  Fund of knowledge:   Good  Insight:    Good  Judgment:   Good  Impulse Control:  Good   Reported Symptoms:  Panic attacks and anxiousness   Risk Assessment: Danger to Self:  No Self-injurious Behavior: No Danger to Others: No Duty to Warn:no Physical Aggression / Violence:No  Access to Firearms a concern: No  Gang Involvement:No  Patient / guardian was educated about steps to take if suicide or homicide risk level increases between visits: yes While future psychiatric events cannot be accurately predicted, the patient does not currently require acute inpatient psychiatric care and does not currently meet [facility] involuntary commitment criteria.  Substance Abuse History: Current substance abuse: No     Past Psychiatric History:   Previous psychological history is significant for anxiety and panic attacks  Outpatient Providers:NA  History of Psych Hospitalization: No   Abuse History: Victim of Abuse No. Patient denies any history of abuse or traumatic experiences. However, patient appeared guarded.    Family History:  Family History  Problem Relation Age of Onset  . Hyperlipidemia Father   . Hyperlipidemia Brother   . Diabetes Paternal Grandmother     Social History:  Social History   Socioeconomic History  . Marital status: Married    Spouse name: Not on file  . Number of children: 3  . Years of education: Not on file  . Highest education level: Not on file  Occupational History  . Not on file  Tobacco Use  . Smoking status: Never Smoker  . Smokeless tobacco: Never Used  Substance and Sexual Activity  . Alcohol use: Never    Alcohol/week: 0.0 standard drinks    Comment: occ  . Drug use: No  . Sexual activity: Yes  Other Topics Concern  . Not on file  Social History Narrative   Patient and her 3 children currently live together. She reports having supportive relationships with  her mom and her brother and reports that she has a few friends. Patient is currently not in a relationship - recently separated with the father of the baby and her youngest child after a 3 year relationship.    Social Determinants of Health   Financial Resource Strain:   . Difficulty of Paying Living Expenses: Not on file  Food Insecurity:   . Worried About Programme researcher, broadcasting/film/videounning Out of Food in the Last Year: Not on file  . Ran Out of Food in the Last Year: Not on file  Transportation Needs:   . Lack of Transportation (Medical): Not on file  . Lack of Transportation (Non-Medical): Not on file  Physical Activity:   . Days of Exercise per Week: Not on file  . Minutes of Exercise per Session: Not on file  Stress:   . Feeling of Stress : Not on file  Social Connections:   . Frequency of Communication with Friends and Family: Not on file  . Frequency of Social Gatherings with Friends and Family: Not on file  . Attends Religious Services: Not on file  . Active Member of Clubs or Organizations: Not on file  . Attends BankerClub or Organization Meetings: Not on file  . Marital Status: Not on file    Living situation: Patient and her 3 children 6018, 6012, and almost 1yo live together.   Sexual Orientation:  Straight  Relationship Status: single  Name of spouse / other:NA             If a parent, number of children / ages: 3911 months, 8312, 1518   Support Systems; mom and her brother   Surveyor, quantityinancial Stress:  Yes   Income/Employment/Disability: Supported by [employer] Friends  Financial plannerMilitary Service: No   Educational History: Education: NA   Religion/Sprituality/World View:   Catholic  Any cultural differences that may affect / interfere with treatment:  not applicable   Recreation/Hobbies: bowling  Stressors:Other: Concernes of upcoming C-section  Strengths:  Supportive Relationships and Family  Barriers:  NA    Legal History: Pending legal issue / charges: The patient has no significant history of legal  issues. History of legal issue / charges: NA  Medical History/Surgical History:reviewed Past Medical History:  Diagnosis Date  . Anxiety   . Gestational diabetes   . Miscarriage     Past Surgical History:  Procedure Laterality Date  . AUGMENTATION MAMMAPLASTY Bilateral 2015   gel implants  . CESAREAN SECTION    . CESAREAN SECTION N/A 05/21/2018   Procedure: CESAREAN SECTION;  Surgeon: Christeen DouglasBeasley, Bethany, MD;  Location: [facility] ORS;  Service: Obstetrics;  Laterality: N/A;  Time of Birth: [birth year] Sex: Female Weight: 7 lb 14 oz   . DILATION AND CURETTAGE OF UTERUS      Medications: Current Outpatient Medications  Medication Sig Dispense Refill  . acetaminophen (TYLENOL) 500 MG tablet Take 2 tablets (1,000 mg total) by mouth every 6 (six) hours. 65 tablet 1  . ibuprofen (ADVIL,MOTRIN) 600 MG tablet Take 1 tablet by mouth three times daily with meals 21 tablet  0  . oxyCODONE-acetaminophen (PERCOCET) 5-325 MG tablet Take 2 tablets by mouth every 6 (six) hours as needed for severe pain. 16 tablet 0  . PRENATAL 28-0.8 MG TABS Take 1 tablet by mouth daily.     No current facility-administered medications for this visit.   No Known Allergies  Maha Keerstin Bjelland is a 38 y.o. year old female  with a reported history of diagnosis of Anxiety, with panic attacks. Patient currently presents with continued anxiety symptoms and also describes panic attacks. Patient endorses mild anxiety symptoms GAD-7 = 7 with last panic attack occurring one month ago. Patient denies any symptoms of depressed mood. Patient reports that these symptoms significantly impact her functioning in multiple life domains.   Due to the above symptoms and patient's reported history, patient is diagnosed with Generalized Anxiety Disorder, With panic attacks. Continued mental health treatment is needed to address patient's symptoms and monitor her safety and stability. Patient is recommended for continued psychiatric medication  management as she reports benefits. And she is also recommended for continued outpatient therapy to further reduce her symptoms and improve her coping strategies.    There is no acute risk for suicide or violence at this time.  While future psychiatric events cannot be accurately predicted, the patient does not require acute inpatient psychiatric care and does not currently meet [facility] involuntary commitment criteria.  Diagnoses:    ICD-10-CM   1. Generalized anxiety disorder  F41.1     Plan of Care: patient's goal is - to be able to control her panic attacks and not feel anxious.  LCSW provided psychoeducation on CBTs. LCSW and patient agreed to develop treatment plan at next session.  Patient agreed to in person sessions at this time.   Future Appointments  Date Time Provider [place]  06/02/2019 11:00 AM Milton Ferguson, LCSW [facility] None   Interpreter used: Eula Fried,

## 2019-06-02 ENCOUNTER — Other Ambulatory Visit: Payer: Self-pay

## 2019-06-02 ENCOUNTER — Ambulatory Visit: Payer: Medicaid Other | Admitting: Licensed Clinical Social Worker

## 2019-06-02 ENCOUNTER — Encounter: Payer: Self-pay | Admitting: Licensed Clinical Social Worker

## 2019-06-02 DIAGNOSIS — F411 Generalized anxiety disorder: Secondary | ICD-10-CM

## 2019-06-02 NOTE — Progress Notes (Signed)
Counselor/Therapist Progress Note  Patient ID: Kristie Stevens, MRN: [id number],    Date: 06/02/2019  Time Spent: 30 minutes (patient arrived late to appt)   Treatment Type: Individual Therapy  Reported Symptoms: Patient reports decrease in symptoms and anxiety is managed with meds and with positive thoughts   Mental Status Exam:  Appearance:   Casual     Behavior:  Appropriate and Sharing  Motor:  Normal  Speech/Language:   Normal Rate  Affect:  Appropriate  Mood:  normal  Thought process:  normal  Thought content:    WNL  Sensory/Perceptual disturbances:    WNL  Orientation:  oriented to person, place, time/date and situation  Attention:  Good  Concentration:  Good  Memory:  WNL  Fund of knowledge:   Good  Insight:    Good  Judgment:   Good  Impulse Control:  Good   Risk Assessment: Danger to Self:  No Self-injurious Behavior: No Danger to Others: No Duty to Warn:no Physical Aggression / Violence:No  Access to Firearms a concern: No  Gang Involvement:No   Subjective: Patient was engaged and cooperative throughout the session using time effectively to discuss thoughts and feelings. Patient voices a desire to discontinue services due to improvement in symptoms. Patient is encouraged to seek care, as needed and is also recommended to continue medication management.   Interventions: Cognitive Behavioral Therapy and Mindfulness Meditation  Checked in with patient and reviewed previous session, including assessment and goal of treatment. Discussed patient's decrease in symptoms due to medication benefit and use of positive thoughts. Discussed patient's desire to discontinue treatment. Encouraged patient to continue to practice "positive thoughts" and also asked permission to teach her about mindfulness. Provided psychoeducation on mindfulness, engaged patient in mindfulness exercise, processed exercise, and encouraged patient to practice. Provided support through active  listening, validation of feelings, and highlighted patient's strengths. Terminated services.   Diagnosis:   ICD-10-CM   1. Generalized anxiety disorder  F41.1     Plan: patient's plan is to continue med management and to seek care in the future, as needed.   Interpreter used: Leron Croak, LCSW

## 2019-06-16 LAB — OB RESULTS CONSOLE GBS: GBS: NEGATIVE

## 2019-06-16 LAB — OB RESULTS CONSOLE GC/CHLAMYDIA
Chlamydia: NEGATIVE
Gonorrhea: NEGATIVE

## 2019-06-16 LAB — OB RESULTS CONSOLE HIV ANTIBODY (ROUTINE TESTING): HIV: NONREACTIVE

## 2019-06-16 LAB — OB RESULTS CONSOLE RPR: RPR: NONREACTIVE

## 2019-06-21 NOTE — H&P (Signed)
Kristie Stevens is a 39 y.o. female presenting for scheduled elective LTCS and BTL  [facility] 07/12/2019  Last c/s complicated by ++ adhesions and EBL 1500 cc . Uterus left in situ .  She has elected for sterilization .  This pregnancy complicated by A1 GDM .- adequately control Also with polyhydramnios   OB History    Gravida  4   Para  3   Term  3   Preterm      AB  1   Living  1     SAB  1   TAB      Ectopic      Multiple  0   Live Births  1          Past Medical History:  Diagnosis Date  . Anxiety   . Gestational diabetes   . Miscarriage    Past Surgical History:  Procedure Laterality Date  . AUGMENTATION MAMMAPLASTY Bilateral 2015   gel implants  . CESAREAN SECTION    . CESAREAN SECTION N/A 05/21/2018   Procedure: CESAREAN SECTION;  Surgeon: Christeen Douglas, MD;  Location: [facility] ORS;  Service: Obstetrics;  Laterality: N/A;  Time of Birth: 11:[birth year] Sex: Female Weight: 7 lb 14 oz   . DILATION AND CURETTAGE OF UTERUS     Family History: family history includes Diabetes in her paternal grandmother; Hyperlipidemia in her brother and father. Social History:  reports that she has never smoked. She has never used smokeless tobacco. She reports that she does not drink alcohol or use drugs.     Maternal Diabetes: Yes:  Diabetes Type:  Diet controlled Genetic Screening: Normal, afp declined  Maternal Ultrasounds/Referrals: Normal Fetal Ultrasounds or other Referrals:  None Maternal Substance Abuse:  No Significant Maternal Medications:  None Significant Maternal Lab Results:  Group B Strep negative Other Comments:  None  Review of Systems History Review of Systems: A full review of systems was performed and negative except as noted in the HPI.   Eyes: no vision change  Ears: left ear pain  Oropharynx: no sore throat  Pulmonary . No shortness of breath , no hemoptysis Cardiovascular: no chest pain , no irregular heart beat  Gastrointestinal:no blood in  stool . No diarrhea, no constipation Uro gynecologic: no dysuria , no pelvic pain Neurologic : no seizure , no migraines    Musculoskeletal: no muscular weakness   unknown if currently breastfeeding. Exam Physical Exam   Lungs CTA   cv RRR without murmur Prenatal labs: ABO, Rh:  O+ Antibody:  neg  Rubella:  Imm . Varicella Imm RPR:   NR HBsAg:   neg HIV:   neg GBS:   neg   Assessment/Plan: Elective repeat LTCS and btl 07/05/2019  concerns for ++ adhesions given last op note   Cell savor requested and MD assistant Pt has been counseled regarding the risk of the surgery and possible inability to perform BTL based on scarring . Blood transfusion risks discussed    Ihor Austin Emersynn Deatley 06/21/2019, 4:51 PM

## 2019-06-30 ENCOUNTER — Encounter
Admission: RE | Admit: 2019-06-30 | Discharge: 2019-06-30 | Disposition: A | Discharge status: Home / Self Care | Payer: Medicaid Other | Source: Ambulatory Visit | Attending: Obstetrics and Gynecology | Admitting: Obstetrics and Gynecology

## 2019-06-30 ENCOUNTER — Other Ambulatory Visit: Payer: Self-pay

## 2019-06-30 NOTE — Patient Instructions (Signed)
Your procedure is scheduled on:  Su procedimiento est programado para: Report to [facility] at 6:00 am (or Emergency Room) Presntese a:   Remember: Instructions that are not followed completely may result in serious medical risk, up to and including death, or upon the discretion of your surgeon and anesthesiologist your surgery may need to be rescheduled.  Recuerde: Las instrucciones que no se siguen completamente Heritage manager en un riesgo de salud grave, incluyendo hasta la [place] o a discrecin de su cirujano y Environmental health practitioner, su ciruga se puede posponer.   __X__ 1. Do not eat food or drink liquids after midnight. No gum chewing or hard candies.  No coma alimentos ni tome lquidos despus de la medianoche.  No mastique chicle ni caramelos  duros.     __X__ 2. No alcohol for 24 hours before or after surgery.    No tome alcohol durante las 24 horas antes ni despus de la [place].   ____ 3. Bring all medications with you on the day of surgery if instructed.    Lleve todos los medicamentos con usted el da de su ciruga si se le ha indicado as.   __X__ 4. Notify your doctor if there is any change in your medical condition (cold, fever,                             infections).    Informe a su mdico si hay algn cambio en su condicin mdica (resfriado, fiebre, infecciones).   Do not wear jewelry, make-up, hairpins, clips or nail polish.  No use joyas, maquillajes, pinzas/ganchos para el cabello ni esmalte de uas.  Do not wear lotions, powders, or perfumes. .  No use lociones, polvos o perfumes.  .    Do not shave 48 hours prior to surgery. Men may shave face and neck.  No se afeite 48 horas antes de la [place].  Los hombres pueden [employer] cara y el cuello.   Do not bring valuables to the hospital.   No lleve objetos [place] is not responsible for any belongings or valuables.  [place] no se hace responsable de ningn tipo de pertenencias u  objetos de Geographical information systems officer.               Contacts, dentures or bridgework may not be worn into surgery.  Los lentes de [place], las dentaduras postizas o puentes no se pueden usar en la [place].  Leave your suitcase in the car. After surgery it may be brought to your room.  Deje su maleta en el auto.  Despus de la ciruga podr traerla a su habitacin.  For patients admitted to the hospital, discharge time is determined by your treatment team.  Para los pacientes que sean ingresados al hospital, el tiempo en el cual se le dar de alta es determinado por su                equipo de [place].   Patients discharged the day of surgery will not be allowed to drive home. A los pacientes que se les da de alta el mismo da de la ciruga no se les permitir conducir a Holiday representative.   Please read over the following fact sheets that you were given: Por favor [place] hojas de informacin que le dieron:    ____ Take these medicines the morning of surgery with A SIP OF WATER:  Tome estas medicinas la maana de la ciruga con UN SORBO DE AGUA:  1. none  2.   3.   4.       5.  6.  ____ Fleet Enema (as directed)          Enema de Fleet (segn lo indicado)    __X__ Use CHG Soap as directed          Utilice el jabn de CHG segn lo indicado  ____ Use inhalers on the day of surgery          Use los inhaladores el da de la ciruga  ____ Stop metformin 2 days prior to surgery    (Pt following up with Dr Feliberto Gottron)          Kirby Crigler de tomar el metformin 2 das antes de la ciruga    ____ Take 1/2 of usual insulin dose the night before surgery and none on the morning of surgery           Tome la mitad de la dosis habitual de insulina la noche antes de la [place] y no tome nada en la maana de la             ciruga  ____ Stop Coumadin/Plavix/aspirin on           Deje de tomar el Coumadin/Plavix/aspirina el da:  ____ Stop Anti-inflammatories on           Deje de tomar antiinflamatorios  el da:   ____ Stop supplements until after surgery            Deje de tomar suplementos hasta despus de la ciruga  ____ Bring C-Pap to the hospital          [employer] C-Pap al hospital   Incentive Spirometer provided with instructions

## 2019-07-01 ENCOUNTER — Other Ambulatory Visit: Payer: Medicaid Other

## 2019-07-04 ENCOUNTER — Other Ambulatory Visit
Admission: RE | Admit: 2019-07-04 | Discharge: 2019-07-04 | Disposition: A | Discharge status: Home / Self Care | Payer: Medicaid Other | Source: Ambulatory Visit | Attending: Obstetrics and Gynecology | Admitting: Obstetrics and Gynecology

## 2019-07-04 ENCOUNTER — Other Ambulatory Visit: Payer: Self-pay

## 2019-07-04 LAB — CBC
HCT: 35.1 % — ABNORMAL LOW (ref 36.0–46.0)
Hemoglobin: 11.2 g/dL — ABNORMAL LOW (ref 12.0–15.0)
MCH: 27.9 pg (ref 26.0–34.0)
MCHC: 31.9 g/dL (ref 30.0–36.0)
MCV: 87.3 fL (ref 80.0–100.0)
Platelets: 275 10*3/uL (ref 150–400)
RBC: 4.02 MIL/uL (ref 3.87–5.11)
RDW: 14.2 % (ref 11.5–15.5)
WBC: 7.6 10*3/uL (ref 4.0–10.5)
nRBC: 0 % (ref 0.0–0.2)

## 2019-07-04 LAB — SARS CORONAVIRUS 2 (TAT 6-24 HRS): SARS Coronavirus 2: NEGATIVE

## 2019-07-04 LAB — BASIC METABOLIC PANEL
Anion gap: 9 (ref 5–15)
BUN: 13 mg/dL (ref 6–20)
CO2: 21 mmol/L — ABNORMAL LOW (ref 22–32)
Calcium: 8.6 mg/dL — ABNORMAL LOW (ref 8.9–10.3)
Chloride: 103 mmol/L (ref 98–111)
Creatinine, Ser: 0.48 mg/dL (ref 0.44–1.00)
GFR calc Af Amer: >60 mL/min (ref >60)
GFR calc non Af Amer: >60 mL/min (ref >60)
Glucose, Bld: 96 mg/dL (ref 70–99)
Potassium: 3.8 mmol/L (ref 3.5–5.1)
Sodium: 133 mmol/L — ABNORMAL LOW (ref 135–145)

## 2019-07-05 ENCOUNTER — Telehealth: Payer: Self-pay | Admitting: Lactation Services

## 2019-07-05 ENCOUNTER — Inpatient Hospital Stay: Payer: Medicaid Other | Admitting: Anesthesiology

## 2019-07-05 ENCOUNTER — Encounter
Admission: RE | Disposition: A | Discharge status: Home / Self Care | Payer: Self-pay | Source: Home / Self Care | Attending: Obstetrics and Gynecology

## 2019-07-05 ENCOUNTER — Inpatient Hospital Stay
Admission: RE | Admit: 2019-07-05 | Discharge: 2019-07-07 | DRG: 784 | Disposition: A | Discharge status: Home / Self Care | Payer: Medicaid Other | Attending: Obstetrics and Gynecology | Admitting: Obstetrics and Gynecology

## 2019-07-05 ENCOUNTER — Encounter: Payer: Self-pay | Admitting: Obstetrics and Gynecology

## 2019-07-05 DIAGNOSIS — O403XX0 Polyhydramnios, third trimester, not applicable or unspecified: Secondary | ICD-10-CM | POA: Diagnosis present

## 2019-07-05 DIAGNOSIS — Z3A39 39 weeks gestation of pregnancy: Secondary | ICD-10-CM | POA: Diagnosis not present

## 2019-07-05 DIAGNOSIS — N736 Female pelvic peritoneal adhesions (postinfective): Secondary | ICD-10-CM | POA: Diagnosis present

## 2019-07-05 DIAGNOSIS — O34211 Maternal care for low transverse scar from previous cesarean delivery: Secondary | ICD-10-CM | POA: Diagnosis present

## 2019-07-05 DIAGNOSIS — F411 Generalized anxiety disorder: Secondary | ICD-10-CM | POA: Diagnosis present

## 2019-07-05 DIAGNOSIS — Z20822 Contact with and (suspected) exposure to covid-19: Secondary | ICD-10-CM | POA: Diagnosis present

## 2019-07-05 DIAGNOSIS — D62 Acute posthemorrhagic anemia: Secondary | ICD-10-CM | POA: Diagnosis not present

## 2019-07-05 DIAGNOSIS — O24425 Gestational diabetes mellitus in childbirth, controlled by oral hypoglycemic drugs: Secondary | ICD-10-CM | POA: Diagnosis present

## 2019-07-05 DIAGNOSIS — O9081 Anemia of the puerperium: Secondary | ICD-10-CM | POA: Diagnosis not present

## 2019-07-05 DIAGNOSIS — Z302 Encounter for sterilization: Secondary | ICD-10-CM

## 2019-07-05 DIAGNOSIS — Z98891 History of uterine scar from previous surgery: Secondary | ICD-10-CM

## 2019-07-05 DIAGNOSIS — O99344 Other mental disorders complicating childbirth: Secondary | ICD-10-CM | POA: Diagnosis present

## 2019-07-05 DIAGNOSIS — O99892 Other specified diseases and conditions complicating childbirth: Secondary | ICD-10-CM | POA: Diagnosis present

## 2019-07-05 LAB — GLUCOSE, CAPILLARY: Glucose-Capillary: 95 mg/dL (ref 70–99)

## 2019-07-05 LAB — ABO/RH: ABO/RH(D): O POS

## 2019-07-05 SURGERY — Surgical Case
Anesthesia: Spinal

## 2019-07-05 MED ORDER — SODIUM CHLORIDE (PF) 0.9 % IJ SOLN
INTRAMUSCULAR | Status: AC
  Filled 2019-07-05: qty 50

## 2019-07-05 MED ORDER — NALOXONE HCL 0.4 MG/ML IJ SOLN: 0.4000 mg | INTRAMUSCULAR | Status: DC | PRN

## 2019-07-05 MED ORDER — ACETAMINOPHEN 500 MG PO TABS
1000.0000 mg | ORAL_TABLET | Freq: Four times a day (QID) | ORAL | Status: DC
  Administered 2019-07-05: 16:00:00 1000 mg via ORAL
  Filled 2019-07-05: qty 2

## 2019-07-05 MED ORDER — DIPHENHYDRAMINE HCL 50 MG/ML IJ SOLN: 12.5000 mg | INTRAMUSCULAR | Status: DC | PRN

## 2019-07-05 MED ORDER — BUPIVACAINE IN DEXTROSE 0.75-8.25 % IT SOLN
INTRATHECAL | Status: DC | PRN
  Administered 2019-07-05: 1.7 mL via INTRATHECAL

## 2019-07-05 MED ORDER — ACETAMINOPHEN 325 MG PO TABS
ORAL_TABLET | ORAL | Status: AC
  Administered 2019-07-06: 1000 mg via ORAL
  Filled 2019-07-05: qty 2

## 2019-07-05 MED ORDER — DIPHENHYDRAMINE HCL 25 MG PO CAPS: 25.0000 mg | ORAL_CAPSULE | ORAL | Status: DC | PRN

## 2019-07-05 MED ORDER — ZOLPIDEM TARTRATE 5 MG PO TABS: 5.0000 mg | ORAL_TABLET | Freq: Every evening | ORAL | Status: DC | PRN

## 2019-07-05 MED ORDER — SODIUM CHLORIDE 0.9 % IV SOLN
INTRAVENOUS | Status: DC | PRN
  Administered 2019-07-05: 50 ug/min via INTRAVENOUS

## 2019-07-05 MED ORDER — LACTATED RINGERS IV SOLN: INTRAVENOUS | Status: DC

## 2019-07-05 MED ORDER — ONDANSETRON HCL 4 MG/2ML IJ SOLN: 4.0000 mg | Freq: Three times a day (TID) | INTRAMUSCULAR | Status: DC | PRN

## 2019-07-05 MED ORDER — NALBUPHINE HCL 10 MG/ML IJ SOLN: 5.0000 mg | INTRAMUSCULAR | Status: DC | PRN

## 2019-07-05 MED ORDER — KETOROLAC TROMETHAMINE 30 MG/ML IJ SOLN: 30.0000 mg | Freq: Four times a day (QID) | INTRAMUSCULAR | Status: AC

## 2019-07-05 MED ORDER — LACTATED RINGERS IV SOLN: Freq: Once | INTRAVENOUS | Status: DC

## 2019-07-05 MED ORDER — OXYCODONE HCL 5 MG PO TABS
5.0000 mg | ORAL_TABLET | ORAL | Status: DC | PRN
  Administered 2019-07-06: 19:00:00 5 mg via ORAL
  Filled 2019-07-05: qty 1

## 2019-07-05 MED ORDER — MISOPROSTOL 200 MCG PO TABS
ORAL_TABLET | ORAL | Status: AC
  Filled 2019-07-05: qty 4

## 2019-07-05 MED ORDER — ACETAMINOPHEN 500 MG PO TABS
1000.0000 mg | ORAL_TABLET | Freq: Four times a day (QID) | ORAL | Status: DC
  Administered 2019-07-05 – 2019-07-07 (×5): 1000 mg via ORAL
  Filled 2019-07-05 (×6): qty 2

## 2019-07-05 MED ORDER — OXYTOCIN 40 UNITS IN NORMAL SALINE INFUSION - SIMPLE MED
INTRAVENOUS | Status: DC | PRN
  Administered 2019-07-05: 1000 mL via INTRAVENOUS

## 2019-07-05 MED ORDER — MORPHINE SULFATE (PF) 0.5 MG/ML IJ SOLN
INTRAMUSCULAR | Status: AC
  Filled 2019-07-05: qty 10

## 2019-07-05 MED ORDER — TETANUS-DIPHTH-ACELL PERTUSSIS 5-2.5-18.5 LF-MCG/0.5 IM SUSP
0.5000 mL | Freq: Once | INTRAMUSCULAR | Status: DC
  Filled 2019-07-05: qty 0.5

## 2019-07-05 MED ORDER — CEFAZOLIN SODIUM-DEXTROSE 2-4 GM/100ML-% IV SOLN
2.0000 g | INTRAVENOUS | Status: AC
  Administered 2019-07-05: 2 g via INTRAVENOUS
  Filled 2019-07-05: qty 100

## 2019-07-05 MED ORDER — SENNOSIDES-DOCUSATE SODIUM 8.6-50 MG PO TABS
2.0000 | ORAL_TABLET | ORAL | Status: DC
  Administered 2019-07-05 – 2019-07-06 (×2): 2 via ORAL
  Filled 2019-07-05 (×3): qty 2

## 2019-07-05 MED ORDER — MENTHOL 3 MG MT LOZG
1.0000 | LOZENGE | OROMUCOSAL | Status: DC | PRN
  Filled 2019-07-05: qty 9

## 2019-07-05 MED ORDER — MORPHINE SULFATE (PF) 0.5 MG/ML IJ SOLN
INTRAMUSCULAR | Status: DC | PRN
  Administered 2019-07-05: .1 mg via INTRATHECAL

## 2019-07-05 MED ORDER — KETOROLAC TROMETHAMINE 30 MG/ML IJ SOLN: 30.0000 mg | Freq: Four times a day (QID) | INTRAMUSCULAR | Status: DC

## 2019-07-05 MED ORDER — KETOROLAC TROMETHAMINE 30 MG/ML IJ SOLN
30.0000 mg | Freq: Four times a day (QID) | INTRAMUSCULAR | Status: DC
  Administered 2019-07-05: 30 mg via INTRAVENOUS
  Filled 2019-07-05: qty 1

## 2019-07-05 MED ORDER — NALBUPHINE HCL 10 MG/ML IJ SOLN: 5.0000 mg | Freq: Once | INTRAMUSCULAR | Status: DC | PRN

## 2019-07-05 MED ORDER — GENTAMICIN SULFATE 40 MG/ML IJ SOLN
INTRAMUSCULAR | Status: AC
  Filled 2019-07-05: qty 2

## 2019-07-05 MED ORDER — MEPERIDINE HCL 25 MG/ML IJ SOLN: 6.2500 mg | INTRAMUSCULAR | Status: DC | PRN

## 2019-07-05 MED ORDER — GABAPENTIN 300 MG PO CAPS
300.0000 mg | ORAL_CAPSULE | Freq: Every day | ORAL | Status: DC
  Administered 2019-07-05: 22:00:00 300 mg via ORAL
  Filled 2019-07-05 (×2): qty 1

## 2019-07-05 MED ORDER — ENOXAPARIN SODIUM 40 MG/0.4ML ~~LOC~~ SOLN
40.0000 mg | SUBCUTANEOUS | Status: DC
  Administered 2019-07-06 – 2019-07-07 (×2): 40 mg via SUBCUTANEOUS
  Filled 2019-07-05 (×3): qty 0.4

## 2019-07-05 MED ORDER — DIPHENHYDRAMINE HCL 25 MG PO CAPS: 25.0000 mg | ORAL_CAPSULE | Freq: Four times a day (QID) | ORAL | Status: DC | PRN

## 2019-07-05 MED ORDER — SODIUM CHLORIDE 0.9% FLUSH
INTRAVENOUS | Status: DC | PRN
  Administered 2019-07-05: 50 mL

## 2019-07-05 MED ORDER — DIBUCAINE (PERIANAL) 1 % EX OINT: 1.0000 "application " | TOPICAL_OINTMENT | CUTANEOUS | Status: DC | PRN

## 2019-07-05 MED ORDER — SIMETHICONE 80 MG PO CHEW
80.0000 mg | CHEWABLE_TABLET | Freq: Three times a day (TID) | ORAL | Status: DC
  Administered 2019-07-05 – 2019-07-07 (×5): 80 mg via ORAL
  Filled 2019-07-05 (×6): qty 1

## 2019-07-05 MED ORDER — ONDANSETRON HCL 4 MG/2ML IJ SOLN
INTRAMUSCULAR | Status: DC | PRN
  Administered 2019-07-05: 4 mg via INTRAVENOUS

## 2019-07-05 MED ORDER — OXYTOCIN 10 UNIT/ML IJ SOLN
INTRAMUSCULAR | Status: DC | PRN
  Administered 2019-07-05: 3 [IU]

## 2019-07-05 MED ORDER — BUPIVACAINE LIPOSOME 1.3 % IJ SUSP
INTRAMUSCULAR | Status: DC | PRN
  Administered 2019-07-05: 20 mL

## 2019-07-05 MED ORDER — METHYLERGONOVINE MALEATE 0.2 MG/ML IJ SOLN
INTRAMUSCULAR | Status: AC
  Filled 2019-07-05: qty 1

## 2019-07-05 MED ORDER — OXYCODONE HCL 5 MG PO TABS: 5.0000 mg | ORAL_TABLET | ORAL | Status: DC | PRN

## 2019-07-05 MED ORDER — FENTANYL CITRATE (PF) 100 MCG/2ML IJ SOLN
INTRAMUSCULAR | Status: AC
  Filled 2019-07-05: qty 2

## 2019-07-05 MED ORDER — KETOROLAC TROMETHAMINE 30 MG/ML IJ SOLN
30.0000 mg | Freq: Four times a day (QID) | INTRAMUSCULAR | Status: AC
  Administered 2019-07-05 – 2019-07-06 (×2): 30 mg via INTRAVENOUS
  Filled 2019-07-05 (×2): qty 1

## 2019-07-05 MED ORDER — CARBOPROST TROMETHAMINE 250 MCG/ML IM SOLN
INTRAMUSCULAR | Status: AC
  Filled 2019-07-05: qty 1

## 2019-07-05 MED ORDER — MORPHINE SULFATE (PF) 2 MG/ML IV SOLN: 1.0000 mg | INTRAVENOUS | Status: DC | PRN

## 2019-07-05 MED ORDER — WITCH HAZEL-GLYCERIN EX PADS: 1.0000 "application " | MEDICATED_PAD | CUTANEOUS | Status: DC | PRN

## 2019-07-05 MED ORDER — BUPIVACAINE HCL (PF) 0.5 % IJ SOLN
INTRAMUSCULAR | Status: DC | PRN
  Administered 2019-07-05: 30 mL

## 2019-07-05 MED ORDER — OXYCODONE HCL 5 MG PO TABS: 10.0000 mg | ORAL_TABLET | ORAL | Status: DC | PRN

## 2019-07-05 MED ORDER — BUPIVACAINE LIPOSOME 1.3 % IJ SUSP
INTRAMUSCULAR | Status: AC
  Filled 2019-07-05: qty 20

## 2019-07-05 MED ORDER — OXYTOCIN 40 UNITS IN NORMAL SALINE INFUSION - SIMPLE MED
2.5000 [IU]/h | INTRAVENOUS | Status: DC
  Administered 2019-07-05: 2.5 [IU]/h via INTRAVENOUS

## 2019-07-05 MED ORDER — NALOXONE HCL 4 MG/10ML IJ SOLN
1.0000 ug/kg/h | INTRAVENOUS | Status: DC | PRN
  Filled 2019-07-05: qty 5

## 2019-07-05 MED ORDER — OXYTOCIN 40 UNITS IN NORMAL SALINE INFUSION - SIMPLE MED
INTRAVENOUS | Status: AC
  Filled 2019-07-05: qty 1000

## 2019-07-05 MED ORDER — COCONUT OIL OIL: 1.0000 "application " | TOPICAL_OIL | Status: DC | PRN

## 2019-07-05 MED ORDER — LACTATED RINGERS IV SOLN: INTRAVENOUS | Status: DC | PRN

## 2019-07-05 MED ORDER — FENTANYL CITRATE (PF) 100 MCG/2ML IJ SOLN
INTRAMUSCULAR | Status: DC | PRN
  Administered 2019-07-05: 15 ug via INTRATHECAL

## 2019-07-05 MED ORDER — ACETAMINOPHEN 325 MG PO TABS
650.0000 mg | ORAL_TABLET | Freq: Four times a day (QID) | ORAL | Status: DC
  Administered 2019-07-05: 10:00:00 650 mg via ORAL

## 2019-07-05 MED ORDER — KETOROLAC TROMETHAMINE 30 MG/ML IJ SOLN
INTRAMUSCULAR | Status: AC
  Administered 2019-07-05: 10:00:00 30 mg
  Filled 2019-07-05: qty 1

## 2019-07-05 MED ORDER — SIMETHICONE 80 MG PO CHEW: 80.0000 mg | CHEWABLE_TABLET | ORAL | Status: DC | PRN

## 2019-07-05 MED ORDER — BUPIVACAINE HCL (PF) 0.5 % IJ SOLN
INTRAMUSCULAR | Status: AC
  Filled 2019-07-05: qty 30

## 2019-07-05 MED ORDER — SIMETHICONE 80 MG PO CHEW
80.0000 mg | CHEWABLE_TABLET | ORAL | Status: DC
  Administered 2019-07-05 – 2019-07-06 (×2): 80 mg via ORAL
  Filled 2019-07-05 (×3): qty 1

## 2019-07-05 MED ORDER — SOD CITRATE-CITRIC ACID 500-334 MG/5ML PO SOLN
30.0000 mL | ORAL | Status: AC
  Administered 2019-07-05: 08:00:00 30 mL via ORAL
  Filled 2019-07-05: qty 30

## 2019-07-05 MED ORDER — PRENATAL MULTIVITAMIN CH
1.0000 | ORAL_TABLET | Freq: Every day | ORAL | Status: DC
  Administered 2019-07-05 – 2019-07-06 (×2): 1 via ORAL
  Filled 2019-07-05 (×3): qty 1

## 2019-07-05 MED ORDER — SODIUM CHLORIDE 0.9% FLUSH: 3.0000 mL | INTRAVENOUS | Status: DC | PRN

## 2019-07-05 SURGICAL SUPPLY — 30 items
BARRIER ADHS 3X4 INTERCEED (GAUZE/BANDAGES/DRESSINGS) ×6 IMPLANT
CANISTER SUCT 3000ML PPV (MISCELLANEOUS) ×3 IMPLANT
CHLORAPREP W/TINT 26 (MISCELLANEOUS) ×3 IMPLANT
COVER WAND RF STERILE (DRAPES) ×3 IMPLANT
DRSG TELFA 3X8 NADH (GAUZE/BANDAGES/DRESSINGS) ×3 IMPLANT
ELECT CAUTERY BLADE 6.4 (BLADE) ×3 IMPLANT
ELECT REM PT RETURN 9FT ADLT (ELECTROSURGICAL) ×3
ELECTRODE REM PT RTRN 9FT ADLT (ELECTROSURGICAL) ×1 IMPLANT
GAUZE SPONGE 4X4 12PLY STRL (GAUZE/BANDAGES/DRESSINGS) ×3 IMPLANT
GLOVE BIO SURGEON STRL SZ8 (GLOVE) ×3 IMPLANT
GOWN STRL REUS W/ TWL LRG LVL3 (GOWN DISPOSABLE) ×2 IMPLANT
GOWN STRL REUS W/ TWL XL LVL3 (GOWN DISPOSABLE) ×1 IMPLANT
GOWN STRL REUS W/TWL LRG LVL3 (GOWN DISPOSABLE) ×4
GOWN STRL REUS W/TWL XL LVL3 (GOWN DISPOSABLE) ×2
NS IRRIG 1000ML POUR BTL (IV SOLUTION) ×3 IMPLANT
PACK C SECTION AR (MISCELLANEOUS) ×3 IMPLANT
PAD OB MATERNITY 4.3X12.25 (PERSONAL CARE ITEMS) ×3 IMPLANT
PAD PREP 24X41 OB/GYN DISP (PERSONAL CARE ITEMS) ×3 IMPLANT
PENCIL SMOKE ULTRAEVAC 22 CON (MISCELLANEOUS) ×3 IMPLANT
RETRACTOR TRAXI PANNICULUS (MISCELLANEOUS) IMPLANT
SPONGE LAP 18X18 RF (DISPOSABLE) ×3 IMPLANT
STAPLER INSORB 30 2030 C-SECTI (MISCELLANEOUS) ×3 IMPLANT
STRAP SAFETY 5IN WIDE (MISCELLANEOUS) ×3 IMPLANT
SUCT VACUUM KIWI BELL (SUCTIONS) IMPLANT
SUT CHROMIC 1 CTX 36 (SUTURE) ×9 IMPLANT
SUT PLAIN GUT 0 (SUTURE) ×12 IMPLANT
SUT VIC AB 0 CT1 36 (SUTURE) ×6 IMPLANT
SUT VIC AB 2-0 SH 27 (SUTURE) ×6
SUT VIC AB 2-0 SH 27XBRD (SUTURE) ×3 IMPLANT
TRAXI PANNICULUS RETRACTOR (MISCELLANEOUS)

## 2019-07-05 NOTE — Anesthesia Preprocedure Evaluation (Signed)
Anesthesia Evaluation  Patient identified by MRN, date of birth, ID band Patient awake    Reviewed: Allergy & Precautions, NPO status , Patient's Chart, lab work & pertinent test results  History of Anesthesia Complications Negative for: history of anesthetic complications  Airway Mallampati: II  TM Distance: >3 FB Neck ROM: Full    Dental no notable dental hx.    Pulmonary neg pulmonary ROS, neg sleep apnea, neg COPD,    breath sounds clear to auscultation- rhonchi (-) wheezing      Cardiovascular Exercise Tolerance: Good (-) hypertension(-) CAD, (-) Past MI, (-) Cardiac Stents and (-) CABG  Rhythm:Regular Rate:Normal - Systolic murmurs and - Diastolic murmurs    Neuro/Psych neg Seizures PSYCHIATRIC DISORDERS Anxiety negative neurological ROS     GI/Hepatic negative GI ROS, Neg liver ROS,   Endo/Other  diabetes, Gestational, Oral Hypoglycemic Agents  Renal/GU negative Renal ROS     Musculoskeletal negative musculoskeletal ROS (+)   Abdominal (+) + obese, Gravid abdomen  Peds  Hematology negative hematology ROS (+)   Anesthesia Other Findings Past Medical History: No date: Anxiety No date: Gestational diabetes No date: Miscarriage   Reproductive/Obstetrics (+) Pregnancy                             Lab Results  Component Value Date   WBC 7.6 07/04/2019   HGB 11.2 (L) 07/04/2019   HCT 35.1 (L) 07/04/2019   MCV 87.3 07/04/2019   PLT 275 07/04/2019    Anesthesia Physical Anesthesia Plan  ASA: II  Anesthesia Plan: Spinal   Post-op Pain Management:    Induction:   PONV Risk Score and Plan: 2 and Ondansetron and Treatment may vary due to age or medical condition  Airway Management Planned: Natural Airway  Additional Equipment:   Intra-op Plan:   Post-operative Plan:   Informed Consent: I have reviewed the patients History and Physical, chart, labs and discussed the  procedure including the risks, benefits and alternatives for the proposed anesthesia with the patient or authorized representative who has indicated his/her understanding and acceptance.     Dental advisory given  Plan Discussed with: CRNA and Anesthesiologist  Anesthesia Plan Comments:         Anesthesia Quick Evaluation

## 2019-07-05 NOTE — Lactation Note (Signed)
This note was copied from a baby's chart. Lactation Consultation Note  Patient Name: Kristie Stevens Date: 07/05/2019 Reason for consult: Follow-up assessment   LC student entered room to follow up with parent after assessing latch in L&D. Mom said that she wished to exclusively formula feed from this point on. [facility] Student informed Mom that she can call for [facility] assistance if she has difficulty with bottle feeding.    Maternal Data Formula Feeding for Exclusion: Yes Reason for exclusion: Mother's choice to formula feed on admision  Feeding Feeding Type: Bottle Fed - Formula Nipple Type: Slow - flow  LATCH Score                   Interventions    Lactation Tools Discussed/Used     Consult Status Consult Status: Complete Date: 07/05/19 Follow-up type: Call as needed    Kristie Stevens 07/05/2019, 4:24 PM

## 2019-07-05 NOTE — Op Note (Signed)
NAME: Kristie Stevens, CHATTERJEE MEDICAL RECORD QB:[id number] ACCOUNT [id number] DATE OF BIRTH:09/12/1980 FACILITY: [facility] LOCATION: [facility] PHYSICIAN:Maize Brittingham Josefine Class, MD  OPERATIVE REPORT  DATE OF PROCEDURE:  07/05/2019  PREOPERATIVE DIAGNOSES: 1.  Elective repeat cesarean section. 2.  Elective permanent sterilization.   3.  39 plus zero weeks estimated gestational age. 4.  Gestational Diabetes 5.  Polyhydramnios    POSTOPERATIVE DIAGNOSES: 1.  Elective repeat cesarean section. 2.  Elective permanent sterilization. 3.  Extensive abdominal pelvic adhesions. 4.  39 plus zero weeks estimated gestational age. 5.  Gestational Diabetes 6.  Polyhydramnios      PROCEDURE: 1.  Extensive abdominal pelvic adhesiolysis. 2.  Low transverse cesarean section. 3.  Bilateral sterilization with partial salpingectomy.  SURGEON:  Laverta Baltimore, MD  FIRST ASSISTANT:  Benjaman Kindler, MD   ANESTHESIA:  Spinal.  INDICATIONS:  This is a 39 year old gravida 4, para 3 at 76 plus zero weeks estimated gestational age.  The patient has elected for a repeat cesarean section and permanent sterilization.  Last surgery was complicated by extensive adhesions and the uterus  was kept in situ.and blood loss at that surgery of 1500 cc   FINDINGS:  Extensive abdominal adhesions to the uterus at the fundal area and bilateral sides.  Vigorous female delivered.  DESCRIPTION OF PROCEDURE:  After adequate spinal anesthesia, the patient was placed in dorsal supine position with a hip roll under the right side.  The patient's abdomen was prepped and draped in normal sterile fashion.  The patient underwent a timeout.   She did receive 2 grams IV Ancef prior to commencement of the case for surgical prophylaxis.  A Pfannenstiel incision was made 2 fingerbreadths above the symphysis pubis.  Sharp dissection was used to identify the fascia.  Fascia was opened in the  midline and opened in a transverse fashion  with dense scarring of the fascial plane.  The recti muscles were ultimately dissected free with some dissection into the rectus muscle given the dense adhesions.  The inferior aspect of the fascia was also  densely adherent to the pyramidalis muscle and this area was freed with sharp dissection.  Entry into the peritoneal cavity was accomplished sharply.  Upon entry into the peritoneal cavity, it was noted that there were multiple adhesions from the uterus  to the abdominal wall.  Greater than 50% of the case then entailed dissecting and freeing the adhesions to mobilize the uterus, so that the uterus could be exteriorized after the delivery of the infant.  Ultimately, the majority of the adhesions were  freed, the lower uterine segment was then free of dissections and a low transverse uterine incision was made.  Upon entry into the endometrial cavity, copious clear fluid resulted consistent with her prior history of polyhydramnios.  Incision was  extended with blunt transverse traction.  The head was then delivered with fundal pressure without difficulty.  Loose nuchal cord was reduced and shoulders and the body of the vigorous female were delivered and infant was dried on the abdomen for 60  seconds.  Infant was then passed to nursery staff who assigned Apgar scores of 9 and 9.  Fetal weight 3720 grams, 8 pounds 3 ounces, vigorous female.  The placenta was then manually delivered and intravenous Pitocin was administered. The uterus was  exteriorized and the endometrial cavity was wiped clean with laparotomy tape and the cervix was opened with a ring forceps.  Uterine incision was then closed with 1 chromic suture in a running locking fashion.  Good approximation of edges.  Good  hemostasis was noted.  Several serosal defects were noted and repaired from the prior dissection of the adhesions.  Multiple figure-of-eight and running 2-0 and 0 Vicryl sutures were utilized to control hemostasis.  Attention was then  directed to the  patient's right fallopian tube, which 2 Kelly clamps were placed at the distal 1/3 of the fallopian tube and the salpingectomy was performed and the pedicle was closed with 2-0 Vicryl suture and a 0 plain gut secondary suture.  Good hemostasis noted.   Similar procedure was repeated on the patient's left fallopian tube.  Again, 2 Kelly clamps were placed in the distal 1/3 of the fallopian tube and a partial salpingectomy was performed and the pedicle was closed with a 2-0 Vicryl suture and an 0 plain  gut suture.  Good hemostasis was noted.  The patient's abdomen was irrigated and the uterus was placed back into the abdominal cavity and the pericolic gutters were wiped clean with laparotomy tape.  The salpingectomy pedicles appeared hemostatic.  The  right side required 1 additional 2-0 Vicryl figure-of-eight suture to control a small amount of oozing.  Ultimately, good hemostasis was noted.  Uterine incision appeared hemostatic.  Interceed was placed over the fundal area to prevent additional  adhesions and the uterine incision was covered with Interceed in a T-shaped fashion.  The fascia was then closed with an 0 Vicryl suture in a running nonlocking fashion with good approximation of edges.  Two separate 0 Vicryl sutures were used to close  the fascia.  The fascial edges were then injected with a solution of 20 mL of 1.3% Exparel plus 30 mL of 0.5% Marcaine plus 50 mL normal saline.  Subcutaneous tissues were freed with the Bovie given the scarring to allow for better cosmetic repair.   Incision was irrigated and good hemostasis was noted and the skin was reapproximated with Insorb absorbable staples with good cosmetic effect and 30 mL of Exparel solution was injected beneath the skin.  ESTIMATED BLOOD LOSS:  Measured 713 mL.  INTRAOPERATIVE FLUIDS:  1500 mL.  URINE OUTPUT:  150 mL.  80 mL of Exparel solution used.  COMPLICATIONS:  There were no complications.  DISPOSITION:   The patient tolerated the procedure well and was taken to recovery room in good condition.  CN/NUANCE  D:07/05/2019 T:07/05/2019 JOB:[id number]

## 2019-07-05 NOTE — Anesthesia Procedure Notes (Signed)
Spinal  Patient location during procedure: OR Start time: 07/05/2019 7:40 AM End time: 07/05/2019 7:43 AM Staffing Performed: anesthesiologist  Anesthesiologist: Penwarden, Amy, MD Preanesthetic Checklist Completed: patient identified, IV checked, site marked, risks and benefits discussed, surgical consent, monitors and equipment checked, pre-op evaluation and timeout performed Spinal Block Patient position: sitting Prep: ChloraPrep Patient monitoring: heart rate, continuous pulse ox and blood pressure Approach: midline Location: L4-5 Injection technique: single-shot Needle Needle type: Introducer and Pencil-Tip  Needle gauge: 24 G Needle length: 9 cm Additional Notes Negative paresthesia. Negative blood return. Positive free-flowing CSF. Expiration date of kit checked and confirmed. Patient tolerated procedure well, without complications.       

## 2019-07-05 NOTE — Transfer of Care (Signed)
Immediate Anesthesia Transfer of Care Note  Patient: Kristie Stevens  Procedure(s) Performed: REPEAT CESAREAN SECTION (N/A )  Patient Location: L&D  Anesthesia Type:Spinal  Level of Consciousness: awake, alert  and oriented  Airway & Oxygen Therapy: Patient Spontanous Breathing  Post-op Assessment: Report given to RN and Post -op Vital signs reviewed and stable  Post vital signs: Reviewed and stable  Last Vitals:  Vitals Value Taken Time  BP 92/51 07/05/19 0911  Temp    Pulse 77 07/05/19 0911  Resp 13 07/05/19 0911  SpO2 100 % 07/05/19 0911    Last Pain:  Vitals:   07/05/19 2419  TempSrc: Oral  PainSc: 0-No pain         Complications: No apparent anesthesia complications

## 2019-07-05 NOTE — Progress Notes (Signed)
Pt ready for repeat LTCS and btl ( if possible ) . Dr Lenise Arena objected to the use of the cell savor despite my concerns of elevated risk of hemorrhage based on last cesarean section and the amount of adhesions . She has assured me that blood will be immediately available if needed intraop . I will proceed without the cell savor

## 2019-07-05 NOTE — Lactation Note (Signed)
This note was copied from a baby's chart. Lactation Consultation Note  Patient Name: Kristie Stevens [id number] Date: 07/05/2019 Reason for consult: Initial assessment;Other (Comment)(Blood sugar concern)  LC and LC Student were called to LDR1 to assess latch due to baby having 2 low blood sugar levels after birth. Baby was latched on the left breast upon entering the room. Baby had great mouth positioning and shape, but body was turned away from parent, and hand was trapped between baby and breast. LC student turned baby's body gently to a more tummy-to-tummy position without disrupting latch. Baby was not doing deep pulls, but non-nutritive sucking with no audible swallows while LC and LC student were in the room. Tips were given to keep baby alert and sucking at the breast. Baby was still nursing when [facility] and [facility] student left the room.     Maternal Data Formula Feeding for Exclusion: No Does the patient have breastfeeding experience prior to this delivery?: Yes  Feeding Feeding Type: Formula Nipple Type: Slow - flow  LATCH Score Latch: Grasps breast easily, tongue down, lips flanged, rhythmical sucking.  Audible Swallowing: None  Type of Nipple: Everted at rest and after stimulation  Comfort (Breast/Nipple): Soft / non-tender  Hold (Positioning): Assistance needed to correctly position infant at breast and maintain latch.  LATCH Score: 7  Interventions Interventions: Adjust position  Lactation Tools Discussed/Used     Consult Status Consult Status: Follow-up Date: 07/05/19 Follow-up type: In-patient    Kristie Stevens 07/05/2019, 12:21 PM

## 2019-07-05 NOTE — Discharge Summary (Signed)
Obstetrical Discharge Summary  Patient Name: Kristie Stevens DOB: [birth year] MRN: [id number]  Date of Admission: 07/05/2019 Date of Delivery: 07/05/2019 Delivered by: Huel Cote MD Date of Discharge: 07/07/19  Primary OB: [place] LMP:No LMP recorded. EDC Estimated Date of Delivery: 07/12/19 Gestational Age at Delivery: 01+7 Antepartum complications: C9SWH , polyhydramnios, repeat cesarean  Admitting Diagnosis: elective repeat LTCS and sterilization Secondary Diagnosis: Patient Active Problem List   Diagnosis Date Noted  . S/P cesarean section 07/06/2019  . Acute blood loss anemia 07/06/2019  . Previous cesarean section 07/05/2019  . Generalized anxiety disorder 05/12/2019    Augmentation: n/a Complications: None Intrapartum complications/course:  Date of Delivery: 07/05/19 Delivered By: Huel Cote MD Delivery Type: primary cesarean section, low transverse incision extensive adhesiolysis, Anesthesia: Spinal  Placenta: spontaneous Laceration:  Episiotomy: none Newborn Data:female  Delivered at [phone] on 07/05/2019 Live born child  Birth Weight:  3720 gm   APGAR: , 9/9  Newborn Delivery   Birth date/time:  Delivery type: C-Section, Low Transverse Trial of labor: No C-section categorization: Repeat        Postpartum Procedures: none   Patient had an uncomplicated postpartum course.  By time of discharge on POD#2, her pain was controlled on oral pain medications; she had appropriate lochia and was ambulating, voiding without difficulty, tolerating regular diet and passing flatus.   She was deemed stable for discharge to home.    Discharge Physical Exam:  BP (!) 97/54 (BP Location: Right Arm) Comment: nurse Raquel Sarna R. notified  Pulse 76   Temp 98.6 F (37 C) (Oral)   Resp 20   Ht 5\' 2"  (1.575 m)   Wt 84.4 kg   SpO2 97%   Breastfeeding Unknown   BMI 34.02 kg/m   General: NAD CV: RRR Pulm: nl effort ABD: s/nd/nt, fundus firm and below the  umbilicus Lochia: moderate Incision: c/d/i DVT Evaluation: LE non-ttp, no evidence of DVT on exam.  Hemoglobin  Date Value Ref Range Status  07/06/2019 9.2 (L) 12.0 - 15.0 g/dL Final   HCT  Date Value Ref Range Status  07/06/2019 29.5 (L) 36.0 - 46.0 % Final     Disposition: stable, discharge to home. Baby Feeding: formula Baby Disposition: home with mom  Rh Immune globulin given: n/a Rubella vaccine given: IMM / Varicella Imm Tdap vaccine given in AP or PP setting: declined Flu vaccine given in AP or PP setting: given 02/16/2019  Contraception: BTL completed  Prenatal Labs:  ABO, Rh:  O+ Antibody:  neg  Rubella:  Imm . Varicella Imm RPR:   NR HBsAg:   neg HIV:   neg GBS:   neg    Plan:  Kristie Stevens was discharged to home in good condition. Follow-up appointment with delivering provider in 6 weeks.  Discharge Medications: Allergies as of 07/07/2019   No Known Allergies     Medication List    STOP taking these medications   metFORMIN 500 MG tablet Commonly known as: GLUCOPHAGE   oxyCODONE-acetaminophen 5-325 MG tablet Commonly known as: Percocet     TAKE these medications   acetaminophen 500 MG tablet Commonly known as: TYLENOL Take 2 tablets (1,000 mg total) by mouth every 6 (six) hours.   busPIRone 5 MG tablet Commonly known as: BUSPAR Take 5 mg by mouth 2 (two) times daily.   docusate sodium 100 MG capsule Commonly known as: Colace Take 1 capsule (100 mg total) by mouth 2 (two) times daily.   ferrous sulfate 325 (65 FE)  MG tablet Take 1 tablet (325 mg total) by mouth 2 (two) times daily with a meal.   ibuprofen 800 MG tablet Commonly known as: ADVIL Take 1 tablet (800 mg total) by mouth every 8 (eight) hours as needed for mild pain or moderate pain. What changed:   medication strength  how much to take  how to take this  when to take this  reasons to take this  additional instructions   oxyCODONE 5 MG immediate release  tablet Commonly known as: Oxy IR/ROXICODONE Take 1-2 tablets (5-10 mg total) by mouth every 4 (four) hours as needed for moderate pain.   Prenatal 28-0.8 MG Tabs Take 1 tablet by mouth daily.       Follow-up Information    Schermerhorn, Ihor Austin, MD. Schedule an appointment as soon as possible for a visit in 2 week(s).   Specialty: Obstetrics and Gynecology Why: Make an appointment in 2 weeks for a post-op visit and 6 weeks for a postpartum visit Contact information: [address] [place] [address] [phone]           Signed: Cyril Mourning [facility] 07/07/19 8:35 AM

## 2019-07-06 ENCOUNTER — Encounter: Payer: Self-pay | Admitting: Obstetrics and Gynecology

## 2019-07-06 DIAGNOSIS — Z98891 History of uterine scar from previous surgery: Secondary | ICD-10-CM

## 2019-07-06 DIAGNOSIS — D62 Acute posthemorrhagic anemia: Secondary | ICD-10-CM

## 2019-07-06 HISTORY — DX: Acute posthemorrhagic anemia: D62

## 2019-07-06 LAB — TYPE AND SCREEN
ABO/RH(D): O POS
Antibody Screen: NEGATIVE
Extend sample reason: UNDETERMINED
Unit division: 0
Unit division: 0

## 2019-07-06 LAB — BPAM RBC
Blood Product Expiration Date: 202103022359
Blood Product Expiration Date: 202103132359
Unit Type and Rh: 5100
Unit Type and Rh: 5100

## 2019-07-06 LAB — CBC
HCT: 29.5 % — ABNORMAL LOW (ref 36.0–46.0)
Hemoglobin: 9.2 g/dL — ABNORMAL LOW (ref 12.0–15.0)
MCH: 27.7 pg (ref 26.0–34.0)
MCHC: 31.2 g/dL (ref 30.0–36.0)
MCV: 88.9 fL (ref 80.0–100.0)
Platelets: 205 10*3/uL (ref 150–400)
RBC: 3.32 MIL/uL — ABNORMAL LOW (ref 3.87–5.11)
RDW: 14.5 % (ref 11.5–15.5)
WBC: 8.3 10*3/uL (ref 4.0–10.5)
nRBC: 0 % (ref 0.0–0.2)

## 2019-07-06 LAB — PREPARE RBC (CROSSMATCH)

## 2019-07-06 LAB — SURGICAL PATHOLOGY

## 2019-07-06 MED ORDER — IBUPROFEN 800 MG PO TABS
800.0000 mg | ORAL_TABLET | Freq: Four times a day (QID) | ORAL | Status: DC | PRN
  Administered 2019-07-06 – 2019-07-07 (×4): 800 mg via ORAL
  Filled 2019-07-06 (×4): qty 1

## 2019-07-06 NOTE — Progress Notes (Signed)
RNCM assessed patient at bedside after consult placed due to Depression screen scores. Patient initially up walking around bed with baby in bassinet and FOB at bedside. Patient reports to feeling well today and that she is ready to possibly go home tomorrow. Patient reports that she has been treated for depression this pregnancy but she does not have a PCP. Discussed need for follow up and resource to continue obtaining medications and patient is open to having resources for PCP. Provided patient with resources for [facility] and she reported she would follow up. Patient reports that she is aware of what PPD is and did not have problems with this in the past. Patient reports to feeling better after starting Buspirone and plans to continue taking. No other needs verbalized at this time and no further concerns noted. RNCM will remain available for any further problems.

## 2019-07-06 NOTE — Progress Notes (Signed)
Postpartum Daily Progress Note   39 y.o. [id number] s/p C-Section, Low Transverse  POD#: 1 INDICATION: Scheduled   Interval History:  Lochia: appropriate Pain: Well managed Voiding: without difficulty Ambulating: without difficulty Tolerating Regular Diet: yes Flatus: positive Other issues or symptoms: none  Objective Vitals:  Temp:  [97.4 F (36.3 C)-98.5 F (36.9 C)] 98.3 F (36.8 C) (02/09 2316) Pulse Rate:  [67-83] 80 (02/09 2316) Resp:  [13-18] 18 (02/09 2316) BP: (92-115)/(51-78) 93/53 (02/09 2316) SpO2:  [93 %-100 %] 97 % (02/10 0515)   Physical Exam: C-section: General: alert and cooperative Breasts: soft/nontender CV: RRR Pulm: nl effort Abdomen: soft, non-tender Uterine Fundus: firm Incision: drainage noted on bandage - no increase in drainage since yesterday afternoon Perineum: intact Lochia: appropriate DVT Evaluation: No evidence of DVT seen on physical exam.   Data: O POS Performed at [facility], [address] [place], [place] [address]   Results for orders placed or performed during the hospital encounter of 07/05/19 (from the past 24 hour(s))  CBC     Status: Abnormal   Collection Time: 07/06/19  6:24 AM  Result Value Ref Range   WBC 8.3 4.0 - 10.5 K/uL   RBC 3.32 (L) 3.87 - 5.11 MIL/uL   Hemoglobin 9.2 (L) 12.0 - 15.0 g/dL   HCT 95.6 (L) 38.7 - 56.4 %   MCV 88.9 80.0 - 100.0 fL   MCH 27.7 26.0 - 34.0 pg   MCHC 31.2 30.0 - 36.0 g/dL   RDW 33.2 95.1 - 88.4 %   Platelets 205 150 - 400 K/uL   nRBC 0.0 0.0 - 0.2 %      A/P:  Active Problems Patient Active Problem List   Diagnosis Date Noted  . S/P cesarean section 07/06/2019  . Acute blood loss anemia 07/06/2019  . Previous cesarean section 07/05/2019  . Generalized anxiety disorder 05/12/2019  . Full-term premature rupture of membranes 05/20/2018    Feeding: Breast Rhogam: n/a Vaccines needed: all up to date or declined Contraception: BTL completed  Postop Post op:  Continue routine postop care  DVT ppx: ambulation Foley: d/c'd  Discharge Plan: POD#2  Haroldine Laws, CNM 07/06/2019 7:54 AM

## 2019-07-06 NOTE — Anesthesia Postprocedure Evaluation (Signed)
Anesthesia Post Note  Patient: Kristie Stevens  Procedure(s) Performed: REPEAT CESAREAN SECTION (N/A )  Patient location during evaluation: Mother Baby Anesthesia Type: Spinal Level of consciousness: oriented and awake and alert Pain management: pain level controlled Vital Signs Assessment: post-procedure vital signs reviewed and stable Respiratory status: spontaneous breathing and respiratory function stable Cardiovascular status: blood pressure returned to baseline and stable Postop Assessment: no headache, no backache, no apparent nausea or vomiting and able to ambulate Anesthetic complications: no     Last Vitals:  Vitals:   07/06/19 0330 07/06/19 0515  BP:    Pulse:    Resp:    Temp:    SpO2: 97% 97%    Last Pain:  Vitals:   07/05/19 2316  TempSrc: Oral  PainSc:                  Elmarie Mainland

## 2019-07-06 NOTE — Anesthesia Post-op Follow-up Note (Signed)
  Anesthesia Pain Follow-up Note  Patient: Kristie Stevens  Day #: 1  Date of Follow-up: 07/06/2019 Time: 7:36 AM  Last Vitals:  Vitals:   07/06/19 0330 07/06/19 0515  BP:    Pulse:    Resp:    Temp:    SpO2: 97% 97%    Level of Consciousness: alert  Pain: none   Side Effects:None  Catheter Site Exam:clean, dry, no drainage  Anti-Coag Meds (From admission, onward)   Start     Dose/Rate Route Frequency Ordered Stop   07/06/19 0800  enoxaparin (LOVENOX) injection 40 mg     40 mg Subcutaneous Every 24 hours 07/05/19 0940         Plan: D/C from anesthesia care at surgeon's request  Kharon Hixon B Alonza Smoker

## 2019-07-07 MED ORDER — IBUPROFEN 800 MG PO TABS: 800.0000 mg | ORAL_TABLET | Freq: Three times a day (TID) | ORAL | 0 refills | Status: DC | PRN

## 2019-07-07 MED ORDER — DOCUSATE SODIUM 100 MG PO CAPS: 100.0000 mg | ORAL_CAPSULE | Freq: Two times a day (BID) | ORAL | Status: AC

## 2019-07-07 MED ORDER — OXYCODONE HCL 5 MG PO TABS: 5.0000 mg | ORAL_TABLET | ORAL | 0 refills | Status: AC | PRN

## 2019-07-07 MED ORDER — FERROUS SULFATE 325 (65 FE) MG PO TABS: 325.0000 mg | ORAL_TABLET | Freq: Two times a day (BID) | ORAL | Status: AC

## 2019-07-07 NOTE — Progress Notes (Signed)
Patient discharged home with infant. Discharge instructions, prescriptions and follow up appointment given to and reviewed with patient. Patient verbalized understanding. Patient wheeled out with infant by NT 

## 2019-07-07 NOTE — Discharge Instructions (Signed)
Postpartum Care After Cesarean Delivery This sheet gives you information about how to care for yourself from the time you deliver your baby to up to 6-12 weeks after delivery (postpartum period). Your health care provider may also give you more specific instructions. If you have problems or questions, contact your health care provider. Follow these instructions at home: Medicines  Take over-the-counter and prescription medicines only as told by your health care provider.  If you were prescribed an antibiotic medicine, take it as told by your health care provider. Do not stop taking the antibiotic even if you start to feel better.  Ask your health care provider if the medicine prescribed to you: ? Requires you to avoid driving or using heavy machinery. ? Can cause constipation. You may need to take actions to prevent or treat constipation, such as:  Drink enough fluid to keep your urine pale yellow.  Take over-the-counter or prescription medicines.  Eat foods that are high in fiber, such as beans, whole grains, and fresh fruits and vegetables.  Limit foods that are high in fat and processed sugars, such as fried or sweet foods. Activity  Gradually return to your normal activities as told by your health care provider.  Avoid activities that take a lot of effort and energy (are strenuous) until approved by your health care provider. Walking at a slow to moderate pace is usually safe. Ask your health care provider what activities are safe for you. ? Do not lift anything that is heavier than your baby or 10 lb (4.5 kg) as told by your health care provider. ? Do not vacuum, climb stairs, or drive a car for as long as told by your health care provider.  If possible, have someone help you at home until you are able to do your usual activities yourself.  Rest as much as possible. Try to rest or take naps while your baby is sleeping. Vaginal bleeding  It is normal to have vaginal bleeding  (lochia) after delivery. Wear a sanitary pad to absorb vaginal bleeding and discharge. ? During the first week after delivery, the amount and appearance of lochia is often similar to a menstrual period. ? Over the next few weeks, it will gradually decrease to a dry, yellow-brown discharge. ? For most women, lochia stops completely by 4-6 weeks after delivery. Vaginal bleeding can vary from woman to woman.  Change your sanitary pads frequently. Watch for any changes in your flow, such as: ? A sudden increase in volume. ? A change in color. ? Large blood clots.  If you pass a blood clot, save it and call your health care provider to discuss. Do not flush blood clots down the toilet before you get instructions from your health care provider.  Do not use tampons or douches until your health care provider says this is safe.  If you are not breastfeeding, your period should return 6-8 weeks after delivery. If you are breastfeeding, your period may return anytime between 8 weeks after delivery and the time that you stop breastfeeding. Perineal care   If your C-section (Cesarean section) was unplanned, and you were allowed to labor and push before delivery, you may have pain, swelling, and discomfort of the tissue between your vaginal opening and your anus (perineum). You may also have an incision in the tissue (episiotomy) or the tissue may have torn during delivery. Follow these instructions as told by your health care provider: ? Keep your perineum clean and dry as told by   your health care provider. Use medicated pads and pain-relieving sprays and creams as directed. ? If you have an episiotomy or vaginal tear, check the area every day for signs of infection. Check for:  Redness, swelling, or pain.  Fluid or blood.  Warmth.  Pus or a bad smell. ? You may be given a squirt bottle to use instead of wiping to clean the perineum area after you go to the bathroom. As you start healing, you may use  the squirt bottle before wiping yourself. Make sure to wipe gently. ? To relieve pain caused by an episiotomy, vaginal tear, or hemorrhoids, try taking a warm sitz bath 2-3 times a day. A sitz bath is a warm water bath that is taken while you are sitting down. The water should only come up to your hips and should cover your buttocks. Breast care  Within the first few days after delivery, your breasts may feel heavy, full, and uncomfortable (breast engorgement). You may also have milk leaking from your breasts. Your health care provider can suggest ways to help relieve breast discomfort. Breast engorgement should go away within a few days.  If you are breastfeeding: ? Wear a bra that supports your breasts and fits you well. ? Keep your nipples clean and dry. Apply creams and ointments as told by your health care provider. ? You may need to use breast pads to absorb milk leakage. ? You may have uterine contractions every time you breastfeed for several weeks after delivery. Uterine contractions help your uterus return to its normal size. ? If you have any problems with breastfeeding, work with your health care provider or a lactation consultant.  If you are not breastfeeding: ? Avoid touching your breasts as this can make your breasts produce more milk. ? Wear a well-fitting bra and use cold packs to help with swelling. ? Do not squeeze out (express) milk. This causes you to make more milk. Intimacy and sexuality  Ask your health care provider when you can engage in sexual activity. This may depend on your: ? Risk of infection. ? Healing rate. ? Comfort and desire to engage in sexual activity.  You are able to get pregnant after delivery, even if you have not had your period. If desired, talk with your health care provider about methods of family planning or birth control (contraception). Lifestyle  Do not use any products that contain nicotine or tobacco, such as cigarettes, e-cigarettes,  and chewing tobacco. If you need help quitting, ask your health care provider.  Do not drink alcohol, especially if you are breastfeeding. Eating and drinking   Drink enough fluid to keep your urine pale yellow.  Eat high-fiber foods every day. These may help prevent or relieve constipation. High-fiber foods include: ? Whole grain cereals and breads. ? Brown rice. ? Beans. ? Fresh fruits and vegetables.  Take your prenatal vitamins until your postpartum checkup or until your health care provider tells you it is okay to stop. General instructions  Keep all follow-up visits for you and your baby as told by your health care provider. Most women visit their health care provider for a postpartum checkup within the first 3-6 weeks after delivery. Contact a health care provider if you:  Feel unable to cope with the changes that a new baby brings to your life, and these feelings do not go away.  Feel unusually sad or worried.  Have breasts that are painful, hard, or turn red.  Have a fever.    Have trouble holding urine or keeping urine from leaking.  Have little or no interest in activities you used to enjoy.  Have not breastfed at all and you have not had a menstrual period for 12 weeks after delivery.  Have stopped breastfeeding and you have not had a menstrual period for 12 weeks after you stopped breastfeeding.  Have questions about caring for yourself or your baby.  Pass a blood clot from your vagina. Get help right away if you:  Have chest pain.  Have difficulty breathing.  Have sudden, severe leg pain.  Have severe pain or cramping in your abdomen.  Bleed from your vagina so much that you fill more than one sanitary pad in one hour. Bleeding should not be heavier than your heaviest period.  Develop a severe headache.  Faint.  Have blurred vision or spots in your vision.  Have a bad-smelling vaginal discharge.  Have thoughts about hurting yourself or your  baby. If you ever feel like you may hurt yourself or others, or have thoughts about taking your own life, get help right away. You can go to your nearest emergency department or call:  Your local emergency services (911 in the [place]  A suicide crisis helpline, such as the National Suicide Prevention Lifeline at 1-800-273-8255. This is open 24 hours a day. Summary  The period of time from when you deliver your baby to up to 6-12 weeks after delivery is called the postpartum period.  Gradually return to your normal activities as told by your health care provider.  Keep all follow-up visits for you and your baby as told by your health care provider. This information is not intended to replace advice given to you by your health care provider. Make sure you discuss any questions you have with your health care provider. Document Revised: 12/30/2017 Document Reviewed: 12/30/2017 Elsevier Patient Education  2020 [employer] Postpartum Baby Blues The postpartum period begins right after the birth of a baby. During this time, there is often a lot of joy and excitement. It is also a time of many changes in the life of the parents. No matter how many times a mother gives birth, each child brings new challenges to the family, including different ways of relating to one another. It is common to have feelings of excitement along with confusing changes in moods, emotions, and thoughts. You may feel happy one minute and sad or stressed the next. These feelings of sadness usually happen in the period right after you have your baby, and they go away within a week or two. This is called the "baby blues." What are the causes? There is no known cause of baby blues. It is likely caused by a combination of factors. However, changes in hormone levels after childbirth are believed to trigger some of the symptoms. Other factors that can play a role in these mood changes include:  Lack of sleep.  Stressful life  events, such as poverty, caring for a loved one, or death of a loved one.  Genetics. What are the signs or symptoms? Symptoms of this condition include:  Brief changes in mood, such as going from extreme happiness to sadness.  Decreased concentration.  Difficulty sleeping.  Crying spells and tearfulness.  Loss of appetite.  Irritability.  Anxiety. If the symptoms of baby blues last for more than 2 weeks or become more severe, you may have postpartum depression. How is this diagnosed? This condition is diagnosed based on an evaluation of your   of your symptoms. There are no medical or lab tests that lead to a diagnosis, but there are various questionnaires that a health care provider may use to identify women with the baby blues or postpartum depression. How is this treated? Treatment is not needed for this condition. The baby blues usually go away on their own in 1-2 weeks. Social support is often all that is needed. You will be encouraged to get adequate sleep and rest. Follow these instructions at home: Lifestyle      Get as much rest as you can. Take a nap when the baby sleeps.  Exercise regularly as told by your health care provider. Some women find yoga and walking to be helpful.  Eat a balanced and nourishing diet. This includes plenty of fruits and vegetables, whole grains, and lean proteins.  Do little things that you enjoy. Have a cup of tea, take a bubble bath, read your favorite magazine, or listen to your favorite music.  Avoid alcohol.  Ask for help with household chores, cooking, grocery shopping, or running errands. Do not try to do everything yourself. Consider hiring a postpartum doula to help. This is a professional who specializes in providing support to new mothers.  Try not to make any major life changes during pregnancy or right after giving birth. This can add stress. General instructions  Talk to people close to you about how you are feeling. Get support  from your partner, family members, friends, or other new moms. You may want to join a support group.  Find ways to cope with stress. This may include: ? Writing your thoughts and feelings in a journal. ? Spending time outside. ? Spending time with people who make you laugh.  Try to stay positive in how you think. Think about the things you are grateful for.  Take over-the-counter and prescription medicines only as told by your health care provider.  Let your health care provider know if you have any concerns.  Keep all postpartum visits as told by your health care provider. This is important. Contact a health care provider if:  Your baby blues do not go away after 2 weeks. Get help right away if:  You have thoughts of taking your own life (suicidal thoughts).  You think you may harm the baby or other people.  You see or hear things that are not there (hallucinations). Summary  After giving birth, you may feel happy one minute and sad or stressed the next. Feelings of sadness that happen right after the baby is born and go away after a week or two are called the "baby blues."  You can manage the baby blues by getting enough rest, eating a healthy diet, exercising, spending time with supportive people, and finding ways to cope with stress.  If feelings of sadness and stress last longer than 2 weeks or get in the way of caring for your baby, talk to your health care provider. This may mean you have postpartum depression. This information is not intended to replace advice given to you by your health care provider. Make sure you discuss any questions you have with your health care provider. Document Revised: 09/03/2018 Document Reviewed: 07/08/2016 Elsevier Patient Education  2020 [employer].

## 2019-07-26 NOTE — Telephone Encounter (Signed)
LC attempted lactation outreach phone call; demonstrating to [employer] [facility] [employer] students how to complete documentation.

## 2019-08-15 ENCOUNTER — Encounter: Payer: Self-pay | Admitting: *Deleted

## 2020-09-13 ENCOUNTER — Other Ambulatory Visit: Payer: Self-pay | Admitting: Internal Medicine

## 2020-09-13 ENCOUNTER — Ambulatory Visit: Admission: RE | Admit: 2020-09-13 | Payer: Medicaid Other | Source: Ambulatory Visit

## 2020-09-13 DIAGNOSIS — R1084 Generalized abdominal pain: Secondary | ICD-10-CM

## 2020-09-13 DIAGNOSIS — R310 Gross hematuria: Secondary | ICD-10-CM

## 2020-09-14 ENCOUNTER — Other Ambulatory Visit: Payer: Self-pay

## 2020-09-14 ENCOUNTER — Ambulatory Visit
Admission: RE | Admit: 2020-09-14 | Discharge: 2020-09-14 | Disposition: A | Discharge status: Home / Self Care | Payer: Medicaid Other | Source: Ambulatory Visit | Attending: Internal Medicine | Admitting: Internal Medicine

## 2020-09-14 DIAGNOSIS — R1084 Generalized abdominal pain: Secondary | ICD-10-CM

## 2020-09-14 DIAGNOSIS — R310 Gross hematuria: Secondary | ICD-10-CM | POA: Diagnosis present

## 2020-09-14 MED ORDER — IOHEXOL 300 MG/ML  SOLN
100.0000 mL | Freq: Once | INTRAMUSCULAR | Status: AC | PRN
  Administered 2020-09-14: 100 mL via INTRAVENOUS

## 2020-10-08 ENCOUNTER — Other Ambulatory Visit: Payer: Medicaid Other

## 2022-03-27 ENCOUNTER — Encounter: Payer: Self-pay | Admitting: *Deleted

## 2022-03-27 ENCOUNTER — Emergency Department: Payer: Medicaid Other

## 2022-03-27 ENCOUNTER — Other Ambulatory Visit: Payer: Self-pay

## 2022-03-27 DIAGNOSIS — K8020 Calculus of gallbladder without cholecystitis without obstruction: Secondary | ICD-10-CM | POA: Insufficient documentation

## 2022-03-27 DIAGNOSIS — Z79899 Other long term (current) drug therapy: Secondary | ICD-10-CM | POA: Insufficient documentation

## 2022-03-27 DIAGNOSIS — R197 Diarrhea, unspecified: Secondary | ICD-10-CM | POA: Diagnosis not present

## 2022-03-27 DIAGNOSIS — Z98891 History of uterine scar from previous surgery: Secondary | ICD-10-CM | POA: Insufficient documentation

## 2022-03-27 DIAGNOSIS — R109 Unspecified abdominal pain: Secondary | ICD-10-CM | POA: Diagnosis present

## 2022-03-27 LAB — LIPASE, BLOOD: Lipase: 26 U/L (ref 11–51)

## 2022-03-27 LAB — CBC
HCT: 41.5 % (ref 36.0–46.0)
Hemoglobin: 14.1 g/dL (ref 12.0–15.0)
MCH: 30.1 pg (ref 26.0–34.0)
MCHC: 34 g/dL (ref 30.0–36.0)
MCV: 88.7 fL (ref 80.0–100.0)
Platelets: 256 10*3/uL (ref 150–400)
RBC: 4.68 MIL/uL (ref 3.87–5.11)
RDW: 11.9 % (ref 11.5–15.5)
WBC: 8.2 10*3/uL (ref 4.0–10.5)
nRBC: 0 % (ref 0.0–0.2)

## 2022-03-27 LAB — COMPREHENSIVE METABOLIC PANEL
ALT: 241 U/L — ABNORMAL HIGH (ref 0–44)
AST: 300 U/L — ABNORMAL HIGH (ref 15–41)
Albumin: 4 g/dL (ref 3.5–5.0)
Alkaline Phosphatase: 87 U/L (ref 38–126)
Anion gap: 9 (ref 5–15)
BUN: 11 mg/dL (ref 6–20)
CO2: 23 mmol/L (ref 22–32)
Calcium: 8.5 mg/dL — ABNORMAL LOW (ref 8.9–10.3)
Chloride: 102 mmol/L (ref 98–111)
Creatinine, Ser: 0.59 mg/dL (ref 0.44–1.00)
GFR, Estimated: >60 mL/min (ref >60)
Glucose, Bld: 170 mg/dL — ABNORMAL HIGH (ref 70–99)
Potassium: 3.4 mmol/L — ABNORMAL LOW (ref 3.5–5.1)
Sodium: 134 mmol/L — ABNORMAL LOW (ref 135–145)
Total Bilirubin: 1.5 mg/dL — ABNORMAL HIGH (ref 0.3–1.2)
Total Protein: 8 g/dL (ref 6.5–8.1)

## 2022-03-27 NOTE — ED Triage Notes (Signed)
Pt with abd pain v/d.  Pt reports feeling weak.  Sx for 2 days.   Pt alert  speech clear.

## 2022-03-28 ENCOUNTER — Emergency Department
Admission: EM | Admit: 2022-03-28 | Discharge: 2022-03-28 | Disposition: A | Discharge status: Home / Self Care | Payer: Medicaid Other | Attending: Emergency Medicine | Admitting: Emergency Medicine

## 2022-03-28 DIAGNOSIS — K8020 Calculus of gallbladder without cholecystitis without obstruction: Secondary | ICD-10-CM

## 2022-03-28 DIAGNOSIS — R1030 Lower abdominal pain, unspecified: Secondary | ICD-10-CM

## 2022-03-28 DIAGNOSIS — R112 Nausea with vomiting, unspecified: Secondary | ICD-10-CM

## 2022-03-28 LAB — URINALYSIS, ROUTINE W REFLEX MICROSCOPIC
Bilirubin Urine: NEGATIVE
Glucose, UA: NEGATIVE mg/dL
Ketones, ur: NEGATIVE mg/dL
Leukocytes,Ua: NEGATIVE
Nitrite: POSITIVE — AB
Protein, ur: 30 mg/dL — AB
Specific Gravity, Urine: 1.019 (ref 1.005–1.030)
pH: 5 (ref 5.0–8.0)

## 2022-03-28 LAB — POC URINE PREG, ED: Preg Test, Ur: NEGATIVE

## 2022-03-28 MED ORDER — SODIUM CHLORIDE 0.9 % IV BOLUS
1000.0000 mL | Freq: Once | INTRAVENOUS | Status: AC
  Administered 2022-03-28: 1000 mL via INTRAVENOUS

## 2022-03-28 MED ORDER — KETOROLAC TROMETHAMINE 30 MG/ML IJ SOLN
15.0000 mg | Freq: Once | INTRAMUSCULAR | Status: AC
  Administered 2022-03-28: 15 mg via INTRAVENOUS
  Filled 2022-03-28: qty 1

## 2022-03-28 MED ORDER — ONDANSETRON 4 MG PO TBDP: 4.0000 mg | ORAL_TABLET | Freq: Three times a day (TID) | ORAL | 0 refills | Status: AC | PRN

## 2022-03-28 MED ORDER — ONDANSETRON HCL 4 MG/2ML IJ SOLN
4.0000 mg | Freq: Once | INTRAMUSCULAR | Status: AC
  Administered 2022-03-28: 4 mg via INTRAVENOUS
  Filled 2022-03-28: qty 2

## 2022-03-28 MED ORDER — OXYCODONE-ACETAMINOPHEN 5-325 MG PO TABS: 1.0000 | ORAL_TABLET | ORAL | 0 refills | Status: DC | PRN

## 2022-03-28 NOTE — ED Provider Notes (Signed)
[facility] Provider Note    Event Date/Time   First MD Initiated Contact with Patient 03/28/22 0150     (approximate)   History   Abdominal Pain   HPI  Female family member interprets for patient  Kristie Stevens is a 41 y.o. female who presents to the ED from home with a chief complaint of whole body pain, nausea, vomiting and diarrhea x2 days.  Endorses generalized weakness.  Denies fever, cough, chest pain or shortness of breath.     Past Medical History   Past Medical History:  Diagnosis Date   Acute blood loss anemia 07/06/2019   Anxiety    Gestational diabetes    Miscarriage      Active Problem List   Patient Active Problem List   Diagnosis Date Noted   S/P cesarean section 07/06/2019   Acute blood loss anemia 07/06/2019   Previous cesarean section 07/05/2019   Generalized anxiety disorder 05/12/2019     Past Surgical History   Past Surgical History:  Procedure Laterality Date   AUGMENTATION MAMMAPLASTY Bilateral 2015   gel implants   CESAREAN SECTION     CESAREAN SECTION N/A 05/21/2018   Procedure: CESAREAN SECTION;  Surgeon: Benjaman Kindler, MD;  Location: [facility] ORS;  Service: Obstetrics;  Laterality: N/A;  Time of Birth: 11:22 Sex: Female Weight: 7 lb 14 oz    CESAREAN SECTION N/A 07/05/2019   Procedure: REPEAT CESAREAN SECTION;  Surgeon: Ouida Sills, Gwen Her, MD;  Location: [facility] ORS;  Service: Obstetrics;  Laterality: N/A;   DILATION AND CURETTAGE OF UTERUS       Home Medications   Prior to Admission medications   Medication Sig Start Date End Date Taking? Authorizing Provider  acetaminophen (TYLENOL) 500 MG tablet Take 2 tablets (1,000 mg total) by mouth every 6 (six) hours. Patient not taking: Reported on 06/20/2019 05/23/18   Ward, Honor Loh, MD  busPIRone (BUSPAR) 5 MG tablet Take 5 mg by mouth 2 (two) times daily. 06/10/19   [provider]  ferrous sulfate 325 (65 FE) MG tablet Take 1 tablet (325 mg  total) by mouth 2 (two) times daily with a meal. 07/07/19 07/06/20  Linda Hedges, CNM  ibuprofen (ADVIL) 800 MG tablet Take 1 tablet (800 mg total) by mouth every 8 (eight) hours as needed for mild pain or moderate pain. 07/07/19   Linda Hedges, CNM  oxyCODONE (OXY IR/ROXICODONE) 5 MG immediate release tablet Take 1-2 tablets (5-10 mg total) by mouth every 4 (four) hours as needed for moderate pain. 07/07/19   Linda Hedges, CNM  PRENATAL 28-0.8 MG TABS Take 1 tablet by mouth daily.    [provider]     Allergies  Patient has no known allergies.   Family History   Family History  Problem Relation Age of Onset   Hyperlipidemia Father    Hyperlipidemia Brother    Diabetes Paternal Grandmother      Physical Exam  Triage Vital Signs: ED Triage Vitals  Enc Vitals Group     BP 03/27/22 2139 110/71     Pulse Rate 03/27/22 2139 (!) 108     Resp 03/27/22 2139 20     Temp 03/27/22 2139 98.6 F (37 C)     Temp Source 03/27/22 2139 Oral     SpO2 03/27/22 2139 98 %     Weight 03/27/22 2140 180 lb (81.6 kg)     Height 03/27/22 2140 5\' 2"  (1.575 m)     Head  Circumference --      Peak Flow --      Pain Score 03/27/22 2140 9     Pain Loc --      Pain Edu? --      Excl. in GC? --     Updated Vital Signs: BP (!) 88/54   Pulse 73   Temp 98.2 F (36.8 C) (Oral)   Resp 16   Ht 5\' 2"  (1.575 m)   Wt 81.6 kg   LMP 03/09/2022 (Approximate)   SpO2 96%   BMI 32.92 kg/m    General: Awake, mild distress.  CV:  RRR.  Good peripheral perfusion.  Resp:  Normal effort.  CTA B. Abd:  Mild suprapubic tenderness to palpation without rebound or guarding.  No pain at McBurney's point.  No distention.  Other:  No truncal vesicles.   ED Results / Procedures / Treatments  Labs (all labs ordered are listed, but only abnormal results are displayed) Labs Reviewed  COMPREHENSIVE METABOLIC PANEL - Abnormal; Notable for the following components:      Result Value   Sodium 134 (*)     Potassium 3.4 (*)    Glucose, Bld 170 (*)    Calcium 8.5 (*)    AST 300 (*)    ALT 241 (*)    Total Bilirubin 1.5 (*)    All other components within normal limits  LIPASE, BLOOD  CBC  URINALYSIS, ROUTINE W REFLEX MICROSCOPIC  POC URINE PREG, ED     EKG  None   RADIOLOGY I have independently visualized and interpreted patient's ultrasound as well as noted the radiology interpretation:  Ultrasound: Fatty liver, cholelithiasis without evidence of acute cholecystitis or choledocholithiasis  Official radiology report(s): 03/11/2022 ABDOMEN LIMITED RUQ (LIVER/GB)  Result Date: 03/28/2022 CLINICAL DATA:  Abdominal pain EXAM: ULTRASOUND ABDOMEN LIMITED RIGHT UPPER QUADRANT COMPARISON:  None Available. FINDINGS: Gallbladder: 2.1 cm gallstone within the gallbladder. No wall thickening or sonographic Murphy sign. Common bile duct: Diameter: Normal caliber, 3 mm Liver: Increased echotexture compatible with fatty infiltration. No focal abnormality or biliary ductal dilatation. Portal vein is patent on color Doppler imaging with normal direction of blood flow towards the liver. Other: None. IMPRESSION: Fatty liver. Cholelithiasis.  No sonographic evidence of acute cholecystitis. Electronically Signed   By: 13/07/2021 M.D.   On: 03/28/2022 00:28     PROCEDURES:  Critical Care performed: No  Procedures   MEDICATIONS ORDERED IN ED: Medications  sodium chloride 0.9 % bolus 1,000 mL (0 mLs Intravenous Stopped 03/28/22 0321)  ondansetron (ZOFRAN) injection 4 mg (4 mg Intravenous Given 03/28/22 0210)  ketorolac (TORADOL) 30 MG/ML injection 15 mg (15 mg Intravenous Given 03/28/22 0210)  sodium chloride 0.9 % bolus 1,000 mL (1,000 mLs Intravenous New Bag/Given 03/28/22 0533)     IMPRESSION / MDM / ASSESSMENT AND PLAN / ED COURSE  I reviewed the triage vital signs and the nursing notes.                             41 year old female presenting with a 2-day history of abdominal pain, N/V/D,  generalized weakness. Differential diagnosis includes, but is not limited to, ovarian cyst, ovarian torsion, acute appendicitis, diverticulitis, urinary tract infection/pyelonephritis, endometriosis, bowel obstruction, colitis, renal colic, gastroenteritis, hernia, fibroids, endometriosis, pregnancy related pain including ectopic pregnancy, etc. I have personally reviewed patient's records and notes a CT scan for hematuria in April/2022 which was negative.  This is following  a PCP office visit for hematuria on 09/13/2020.  Patient's presentation is most consistent with acute illness / injury with system symptoms.  Laboratory results demonstrate normal WBC 8.2, elevated transaminases and T. bili with normal lipase.  This is likely secondary to patient's vomiting.  Ultrasound demonstrates cholelithiasis without evidence of cholecystitis.  Administer IV hydration, IV Ketorolac for pain, IV Zofran for nausea and reassess.  Clinical Course as of 03/28/22 0653  Fri Mar 28, 2022  0017 Patient asleep.  No vomiting in the ED.  Nausea resolved.  Does not feel like she can urinate yet.  We will hang second liter IV fluids. [JS]  [phone] Patient is soundly asleep, finishing up second liter IV fluids.  Awaiting urine specimen.  Care will be transferred to Dr. Larinda Buttery at change of shift.  Anticipate patient may be able to be discharged home; will prepare prescription for Zofran +/- antibiotics if she has UTI. [JS]    Clinical Course User Index [JS] Irean Hong, MD     FINAL CLINICAL IMPRESSION(S) / ED DIAGNOSES   Final diagnoses:  Nausea vomiting and diarrhea  Lower abdominal pain  Calculus of gallbladder without cholecystitis without obstruction     Rx / DC Orders   ED Discharge Orders     None        Note:  This document was prepared using Dragon voice recognition software and may include unintentional dictation errors.   Irean Hong, MD 03/28/22 [phone]

## 2022-03-28 NOTE — ED Notes (Signed)
Pt said she still can not urinate at this time

## 2022-03-28 NOTE — Discharge Instructions (Addendum)
1.  You may take medicines as needed for pain and nausea (Percocet/Zofran #20). 2.  Clear liquids x12 hours, then BRAT diet x3 days, then following bland diet until you see the surgeon. 3.  Return to the ER for worsening symptoms, persistent vomiting, difficulty breathing or other concerns.

## 2022-03-28 NOTE — ED Notes (Signed)
Patient ambulatory to restroom at this time for UA sample.

## 2022-03-28 NOTE — ED Provider Notes (Signed)
-----------------------------------------   7:04 AM on 03/28/2022 -----------------------------------------  Blood pressure (!) 96/57, pulse 72, temperature 98.2 F (36.8 C), temperature source Oral, resp. rate 16, height 5\' 2"  (1.575 m), weight 81.6 kg, last menstrual period 03/09/2022, SpO2 98 %, unknown if currently breastfeeding.  Assuming care from Dr. Beather Arbour.  In short, Kristie Stevens is a 41 y.o. female with a chief complaint of Abdominal Pain .  Refer to the original H&P for additional details.  The current plan of care is to follow-up UA in setting of suspected biliary colic.  ----------------------------------------- 8:39 AM on 03/28/2022 ----------------------------------------- Urinalysis is borderline for infection but with significant squamous cells consistent with contamination.  Patient currently denies any urinary symptoms, we will send for culture and hold off on antibiotics.  On reassessment, she reports abdominal pain is much improved and no vomiting here in the ED.  She is appropriate for discharge home with general surgery follow-up for suspected biliary colic.  She was counseled to return to the ED for new or worsening symptoms, patient agrees with plan.    Blake Divine, MD 03/28/22 [phone]

## 2022-03-30 LAB — URINE CULTURE: Culture: >=100000 — AB

## 2022-03-31 NOTE — Progress Notes (Signed)
ED Antimicrobial Stewardship Positive Culture Follow Up   Kristie Stevens is an 41 y.o. female who presented to [facility] on 03/28/2022 with a chief complaint of  Chief Complaint  Patient presents with   Abdominal Pain    Recent Results (from the past 720 hour(s))  Urine Culture     Status: Abnormal   Collection Time: 03/28/22  7:26 AM   Specimen: Urine, Clean Catch  Result Value Ref Range Status   Specimen Description   Final    URINE, CLEAN CATCH Performed at [facility], [address], [place], [place] [address]    Special Requests   Final    NONE Performed at [facility], [address] [place], [place] [address]    Culture >=100,000 COLONIES/mL ESCHERICHIA COLI (A)  Final   Report Status 03/30/2022 FINAL  Final   Organism ID, Bacteria ESCHERICHIA COLI (A)  Final      Susceptibility   Escherichia coli - MIC*    AMPICILLIN <=2 SENSITIVE Sensitive     CEFAZOLIN <=4 SENSITIVE Sensitive     CEFEPIME <=0.12 SENSITIVE Sensitive     CEFTRIAXONE <=0.25 SENSITIVE Sensitive     CIPROFLOXACIN >=4 RESISTANT Resistant     GENTAMICIN <=1 SENSITIVE Sensitive     IMIPENEM <=0.25 SENSITIVE Sensitive     NITROFURANTOIN <=16 SENSITIVE Sensitive     TRIMETH/SULFA <=20 SENSITIVE Sensitive     AMPICILLIN/SULBACTAM <=2 SENSITIVE Sensitive     PIP/TAZO <=4 SENSITIVE Sensitive     * >=100,000 COLONIES/mL ESCHERICHIA COLI    [x]  Patient discharged originally without antimicrobial agent and treatment is now indicated  Contacted patient using [employer] and left a voicemail asking patient to call me back at [phone], as I am wanting to assess whether she is still experiencing symptoms.  Dara Hoyer, PharmD PGY-1 Pharmacy Resident 03/31/2022 9:04 AM

## 2022-04-14 ENCOUNTER — Other Ambulatory Visit
Admission: RE | Admit: 2022-04-14 | Discharge: 2022-04-14 | Disposition: A | Discharge status: Home / Self Care | Payer: Medicaid Other | Source: Ambulatory Visit | Attending: Surgery | Admitting: Surgery

## 2022-04-14 ENCOUNTER — Encounter: Payer: Self-pay | Admitting: Surgery

## 2022-04-14 ENCOUNTER — Ambulatory Visit (INDEPENDENT_AMBULATORY_CARE_PROVIDER_SITE_OTHER): Payer: Medicaid Other | Admitting: Surgery

## 2022-04-14 VITALS — BP 114/74 | HR 69 | Temp 98.6°F | Ht 62.0 in | Wt 181.6 lb

## 2022-04-14 DIAGNOSIS — K8020 Calculus of gallbladder without cholecystitis without obstruction: Secondary | ICD-10-CM

## 2022-04-14 DIAGNOSIS — R7989 Other specified abnormal findings of blood chemistry: Secondary | ICD-10-CM

## 2022-04-14 LAB — CBC WITH DIFFERENTIAL/PLATELET
Abs Immature Granulocytes: 0.02 10*3/uL (ref 0.00–0.07)
Basophils Absolute: 0 10*3/uL (ref 0.0–0.1)
Basophils Relative: 0 %
Eosinophils Absolute: 0.2 10*3/uL (ref 0.0–0.5)
Eosinophils Relative: 3 %
HCT: 39 % (ref 36.0–46.0)
Hemoglobin: 13.5 g/dL (ref 12.0–15.0)
Immature Granulocytes: 0 %
Lymphocytes Relative: 42 %
Lymphs Abs: 2.5 10*3/uL (ref 0.7–4.0)
MCH: 31.2 pg (ref 26.0–34.0)
MCHC: 34.6 g/dL (ref 30.0–36.0)
MCV: 90.1 fL (ref 80.0–100.0)
Monocytes Absolute: 0.6 10*3/uL (ref 0.1–1.0)
Monocytes Relative: 10 %
Neutro Abs: 2.6 10*3/uL (ref 1.7–7.7)
Neutrophils Relative %: 45 %
Platelets: 260 10*3/uL (ref 150–400)
RBC: 4.33 MIL/uL (ref 3.87–5.11)
RDW: 11.9 % (ref 11.5–15.5)
WBC: 5.9 10*3/uL (ref 4.0–10.5)
nRBC: 0 % (ref 0.0–0.2)

## 2022-04-14 LAB — COMPREHENSIVE METABOLIC PANEL
ALT: 105 U/L — ABNORMAL HIGH (ref 0–44)
AST: 69 U/L — ABNORMAL HIGH (ref 15–41)
Albumin: 4.2 g/dL (ref 3.5–5.0)
Alkaline Phosphatase: 88 U/L (ref 38–126)
Anion gap: 8 (ref 5–15)
BUN: 12 mg/dL (ref 6–20)
CO2: 25 mmol/L (ref 22–32)
Calcium: 9.1 mg/dL (ref 8.9–10.3)
Chloride: 107 mmol/L (ref 98–111)
Creatinine, Ser: 0.52 mg/dL (ref 0.44–1.00)
GFR, Estimated: >60 mL/min (ref >60)
Glucose, Bld: 85 mg/dL (ref 70–99)
Potassium: 3.7 mmol/L (ref 3.5–5.1)
Sodium: 140 mmol/L (ref 135–145)
Total Bilirubin: 0.8 mg/dL (ref 0.3–1.2)
Total Protein: 7.6 g/dL (ref 6.5–8.1)

## 2022-04-14 NOTE — Patient Instructions (Addendum)
MRCP scheduled @ [place] on 04/26/22 @8 :30. Nothing to eat/drink 4 hours prior. Please see your follow up appointment listed below.       Kristie Cholelithiasis  Stevens Kristie es una enfermedad en Stevens que se forman clculos biliares en Stevens vescula biliar. Stevens vescula biliar es un rgano que almacena bilis. Stevens bilis es un lquido que interviene en Stevens digestin de las [name]. Los clculos comienzan como pequeos cristales y lentamente pueden transformarse en piedras. Es posible que no causen sntomas hasta que obstruyen el conducto de Stevens vescula biliar o el conducto qustico, cuando Stevens vescula biliar se tensa (contrae) despus de comer alimentos. Esto puede causar dolor y se conoce como ataque de vescula biliar o clico biliar. Hay dos tipos principales de clculos biliares: Clculos de colesterol. Estos son el tipo de clculo biliar ms frecuente. Estos clculos se forman por el colesterol endurecido y generalmente son de color amarillo verdoso. El colesterol es una sustancia parecida a Stevens grasa que se forma en el hgado. Clculos de pigmento. Estos son de color oscuro y estn formados por una sustancia amarillo rojiza, llamada bilirrubina,que se forma cuando Stevens hemoglobina de los glbulos rojos se descompone. Cules son las causas? Stevens causa de esta afeccin puede ser un desequilibrio en las diferentes partes que producen Stevens bilis. Esto puede suceder si Stevens bilis: Tiene mucha bilirrubina. Esto puede suceder en ciertas enfermedades de Stevens sangre, como Stevens anemia drepanoctica. Tiene mucho colesterol. No tiene suficiente sales biliares. Estas sales le ayudan al organismo a Tax adviser y a Chief Financial Officer. En ciertos casos, esta afeccin tambin puede ser causada por una vescula biliar que no se vaca completamente o lo suficientemente seguido. Esto es frecuente Solicitor. Qu incrementa el riesgo? Los siguientes factores pueden hacer que sea ms propenso a Emergency planning/management officer  afeccin: Ser mujer. Tener embarazos mltiples. Algunas veces los mdicos aconsejan extirpar los clculos biliares antes de futuros embarazos. Tener una dieta con demasiadas comidas fritas, grasas y carbohidratos refinados, como el pan blanco y el arroz blanco. Ser obeso. Ser mayor de 72 aos de edad. Usar medicamentos que contienen hormonas femeninas (estrgenos) durante un Environmental education officer. Perder peso con rapidez. Antecedentes familiares de clculos biliares. Tener ciertos problemas mdicos, por ejemplo: Diabetes mellitus. Fibrosis qustica. Enfermedad de Crohn. Cirrosis u otra enfermedad heptica a largo plazo (crnica). Determinadas enfermedades de Stevens sangre, como anemia drepanoctica o leucemia. Cules son los signos o sntomas? En muchos casos, tener clculos biliares no causa sntomas. Si tiene clculos biliares pero no tiene sntomas, tiene clculos silenciosos. Si un clculo biliar bloquea el conducto biliar, puede causar un ataque de vescula biliar. El principal sntoma de un ataque de vescula biliar es un dolor repentino en Stevens parte superior derecha del abdomen. El dolor: Aparece generalmente a Stevens noche o despus de comer comidas abundantes. Puede durar [name] hora o ms. Se puede extender hacia el hombro derecho, Stevens espalda o el pecho. Puede sentirse como indigestin. Esto es [name], ardor o sensacin de plenitud en Stevens parte superior del abdomen. Si el conducto biliar est bloqueado durante ms de algunas horas, puede causar infeccin o inflamacin de Stevens vescula biliar (colecistitis), del hgado o del pncreas. Esto puede causar lo siguiente: Nuseas o vmitos. Distensin. Dolor abdominal que dura 5 horas o ms. Dolor con Stevens palpacin en Stevens parte superior del abdomen, a menudo en Stevens seccin superior derecha y debajo de Stevens caja torcica. Fiebre o escalofros. Stevens piel o las partes blancas de los ojos  se ponen amarillas (ictericia). En general esto ocurre cuando una piedra ha  obstruido el paso de Stevens bilis a travs del conducto biliar comn. Orina de color oscuro o heces de color plido. Cmo se diagnostica? Esta afeccin se puede diagnosticar en funcin de lo siguiente: Un examen fsico. Sus antecedentes mdicos. Una ecografa. Exploracin por tomografa computarizada (TC). Resonancia magntica (RM). Tambin pueden hacerle otras pruebas, incluidas las siguientes: Anlisis de sangre para Hydrographic surveyor signos de infeccin o inflamacin. Una colecistografa o gammagrafa hepatobiliar HIDA. Este es un estudio de Stevens vescula biliar y los conductos biliares (sistema biliar) que Environmental consultant material radiactivo no daino y Teacher, early years/pre que pueden Forensic psychologist radiactivo. Colangiopancreatografa retrgrada endoscpica. Este [employer] en insertar un pequeo tubo con una cmara en el extremo (endoscopio) a travs de Stevens boca para observar los conductos biliares y Hydrographic surveyor obstrucciones. Cmo se trata? El tratamiento de esta afeccin depende de Stevens gravedad. Los clculos silenciosos no requieren Clinical research associate. Si una obstruccin causa un ataque de vescula biliar u otros sntomas, puede ser necesario Electrical engineer. El tratamiento puede incluir: Cuidados en el hogar, si los sntomas no son graves. Durante un ataque simple de vescula biliar, deje de comer y beber durante 12 a 24 horas (excepto agua y lquidos transparentes). Esto ayuda a "enfriar" Stevens vescula biliar. Despus de 1 o 2 das, puede comenzar a consumir alimentos simples o transparentes, como caldos y Administrator. Tambin es posible que necesite medicamentos para el dolor o las nuseas, o para ambos. Si tiene colecistitis y [place] infeccin, Designer, industrial/product antibiticos. Hospitalizacin, si es necesaria para controlar del dolor o en caso de colecistitis con infeccin grave. Una colecistectoma, o ciruga para extirpar Stevens vescula biliar. Este es el tratamiento ms frecuente si todos los dems tratamientos no han  funcionado. Medicamentos para destruir los clculos biliares. Estos son ms efectivos para tratar clculos pequeos. Los medicamentos pueden usarse durante hasta 6 a 12 meses. Colangiopancreatografa retrgrada endoscpica. Se puede anexar una pequea cesta al endoscopio y usarse para capturar y eliminar los clculos, especialmente los que se encuentran en el conducto biliar comn. Siga estas instrucciones en su casa: Medicamentos Use los medicamentos de venta libre y los recetados solamente como se lo haya indicado el mdico. Si le recetaron un antibitico, tmelo como se lo haya indicado el mdico. No deje de tomar el antibitico aunque comience a sentirse mejor. Pregntele al mdico si el medicamento recetado le impide conducir o usar [place]. Comida y bebida Beba suficiente lquido como para Theatre manager Stevens orina de color amarillo plido. Esto es importante durante un ataque de vescula biliar. Es preferible tomar agua y lquidos claros. Siga una dieta saludable. Esto puede comprender lo siguiente: Reducir los [employer], como los alimentos fritos y los alimentos con alto contenido de Research officer, trade union. Reducir los carbohidratos refinados, como el pan blanco y el arroz blanco. Consumir ms [place]. Alimentarse con alimentos como almendras, frutas y frijoles. Consumo de alcohol Si bebe alcohol: Limite Stevens cantidad que bebe: De 0 a 1 medida por da para las mujeres que no estn embarazadas. De 0 a 2 medidas por da para los hombres. Est atento a Stevens cantidad de alcohol que hay en las bebidas que toma. En los [place], una medida equivale a una botella de cerveza de 12 oz (355 ml), un vaso de vino de 5 oz (148 ml) o un vaso de una bebida alcohlica de alta graduacin de 1 oz (44 ml). Instrucciones generales No consuma ningn producto que contenga nicotina o tabaco,  como cigarrillos, cigarrillos electrnicos y tabaco de Higher education careers adviser. Si necesita ayuda para dejar de fumar, consulte al  mdico. Mantenga un peso saludable. Concurra a todas las visitas de seguimiento como se lo haya indicado el mdico. Estas pueden incluir consultas con un cirujano o un especialista. Esto es importante. Dnde buscar ms informacin [employer] of Diabetes and Digestive and Kidney Diseases ([place] Diabetes y [place] y [place]): [email or web address] Comunquese con un mdico si: Piensa que ha tenido un ataque de vescula biliar. Le han diagnosticado clculos silenciosos y Scientist, clinical (histocompatibility and immunogenetics) en el abdomen o indigestin. Comienza a tener ataques con ms frecuencia. Orina de color oscuro o tiene heces de color plido. Solicite ayuda de inmediato si: Tiene dolor por un ataque de vescula biliar que dura ms de 2 horas. Tiene dolor en el abdomen que dura ms de 5 horas o est empeorando. Tiene fiebre o escalofros. Tiene nuseas y vmitos que no desaparecen. Tiene ictericia. Resumen Stevens Kristie es una enfermedad en Stevens que se forman clculos biliares en Stevens vescula biliar. Stevens causa de esta afeccin puede ser un desequilibrio en las diferentes partes que producen Stevens bilis. Esto puede suceder si Stevens bilis tiene demasiado colesterol o bilirrubina, o contiene cantidad insuficiente de sales biliares. El tratamiento de los clculos biliares depende de Stevens gravedad de Stevens afeccin. Los clculos silenciosos no requieren Clinical research associate. Si los clculos causan un ataque de vescula biliar u otros sntomas, el tratamiento generalmente implica no comer ni beber nada. El tratamiento tambin puede incluir analgsicos y antibiticos, y a Media planner. Stevens ciruga para extirpar Stevens vescula biliar es frecuente si todos los dems tratamientos no han funcionado. Esta informacin no tiene Marine scientist el consejo del mdico. Asegrese de hacerle al mdico cualquier pregunta que tenga. Document Revised: 06/17/2019 Document Reviewed: 06/17/2019 Elsevier Patient  Education  [place] mnimamente invasiva Minimally Invasive Cholecystectomy Una colecistectoma mnimamente invasiva es una ciruga que se realiza para extirpar Stevens vescula biliar. Stevens vescula biliar es un rgano que tiene forma de pera y se encuentra debajo del hgado, del lado derecho del cuerpo. Stevens vescula biliar almacena bilis, un lquido que ayuda al organismo a digerir las grasas. Stevens colecistectoma se realiza con frecuencia para tratar Stevens inflamacin (irritacin e hinchazn) de Stevens vescula biliar (colecistitis). Por lo general, esta afeccin se debe a una acumulacin de clculos biliares (Kristie) en Stevens vescula biliar o al estancamiento del lquido de Stevens vescula biliar a causa de que los clculos biliares se atascan en los conductos (tubos) y obstruyen el paso de Stevens bilis. Esto puede producir inflamacin y Social research officer, government. En los [employer], podr ser [place]. Este procedimiento se realiza a travs de pequeas incisiones en el abdomen, en lugar de una incisin grande. Tambin se denomina "ciruga laparoscpica". Se introduce un endoscopio delgado que tiene Public relations account executive (laparoscopio) a travs de una incisin. A travs de las otras incisiones, se introducen los instrumentos quirrgicos. En algunos casos, es posible que [place] mnimamente invasiva deba cambiarse a una [place] realizada a travs de una incisin ms grande. Esta se denomina "ciruga abierta". Informe al mdico acerca de lo siguiente: Cualquier alergia que tenga. Todos los [employer] [place], incluidos vitaminas, hierbas, gotas oftlmicas, cremas y medicamentos de venta libre. Problemas previos que usted o algn miembro de su familia hayan tenido con los anestsicos. Cualquier problema de Stevens sangre que tenga. Cirugas a las que se haya sometido. Cualquier afeccin mdica que tenga. Si  est embarazada o podra estarlo. Cules son los riesgos? En general, se trata de un  procedimiento seguro. Sin embargo, pueden ocurrir complicaciones, por ejemplo: Infeccin. Sangrado. Reacciones alrgicas a los medicamentos. Daos a las estructuras o los rganos cercanos. Un clculo biliar que queda en el conducto biliar comn. El conducto coldoco transporta Stevens bilis desde Stevens vescula biliar hasta el intestino delgado. Una filtracin de bilis del hgado o del conducto qustico despus de que se extirpa Stevens vescula biliar. Qu ocurre antes del procedimiento? Cundo dejar de comer y beber Siga las instrucciones del mdico con respecto a lo que puede comer y beber antes del procedimiento. Pueden incluir: Ocho horas antes del procedimiento Deje de comer Stevens mayora de los alimentos. No coma carne, alimentos fritos ni alimentos grasos. Consuma solo alimentos livianos, como tostadas o Social worker. Todos los lquidos son aceptables, excepto las bebidas energticas y el alcohol. Seis horas antes del procedimiento Deje de comer. Beba nicamente lquidos transparentes, como agua, jugo de fruta transparente, caf solo, t solo y bebidas deportivas. No consuma bebidas energticas ni alcohol. Dos horas antes del procedimiento Deje de beber todos los lquidos. Es posible que le permitan tomar medicamentos con pequeos sorbos de [place]. Si no sigue las instrucciones del mdico, el procedimiento puede retrasarse o cancelarse. Medicamentos Consulte al mdico si debe hacer o no lo siguiente: Multimedia programmer o suspender los medicamentos que [place] habitualmente. Esto es muy importante si toma medicamentos para Stevens diabetes o anticoagulantes. Tomar medicamentos como aspirina e ibuprofeno. Estos medicamentos pueden tener un efecto anticoagulante en Stevens [place]. No tome estos medicamentos a menos que el mdico se lo indique. Usar medicamentos de venta libre, vitaminas, hierbas y suplementos. Instrucciones generales Si va a marcharse a su casa inmediatamente despus del procedimiento, pdale a un adulto  responsable que: Lo lleve a su casa desde el hospital o Stevens clnica. No se le permitir conducir. Lo cuide durante el [employer] indiquen. No consuma ningn producto que contenga nicotina ni tabaco durante al [employer] las 4 semanas anteriores al procedimiento. Estos productos incluyen cigarrillos, tabaco para Theatre manager y aparatos de vapeo, como los Administrator, Civil Service. Si necesita ayuda para dejar de fumar, consulte al [employer]. Pregntele al mdico: Cmo se Forensic psychologist de Stevens Leisure centre manager. Qu medidas se tomarn para evitar una infeccin. Pueden incluir: Rasurar el vello del lugar de Stevens ciruga. Lavar Stevens piel con un jabn antisptico. Recibir antibiticos. Qu ocurre durante el procedimiento?  Le colocarn una va intravenosa (i.v.) en una vena. Le administrarn uno de los siguientes medicamentos o ambos: Un medicamento para ayudar a Lexicographer (sedante). Un medicamento que lo har dormir (anestesia general). Su cirujano le har varias incisiones pequeas en el abdomen. El laparoscopio se introducir a travs de una de las pequeas incisiones. Stevens cmara del laparoscopio enviar imgenes a un monitor que se encuentra en el quirfano. Esto permitir a su Pensions consultant del abdomen. Le inyectarn un gas en el abdomen. Esto expandir el abdomen para que el cirujano tenga ms lugar para Facilities manager. El resto del instrumental necesario para el procedimiento se introducir a travs de las otras incisiones. Se extirpar Stevens vescula biliar a travs de una de las incisiones. Se puede examinar el conducto coldoco. Si se encuentran clculos en Stevens va biliar, tal vez deban extirparse. Despus de Stevens extirpacin de Stevens vescula biliar, se cerrarn las incisiones con puntos (suturas), grapas o goma para cerrar Stevens piel. Las incisiones pueden cubrirse con una venda (vendaje). El procedimiento puede  variar segn el mdico y el hospital. Sander Nephew ocurre despus del procedimiento? Le controlarn Stevens presin  arterial, Stevens frecuencia cardaca, Stevens frecuencia respiratoria y Retail buyer de oxgeno en Stevens sangre hasta que le den el alta del hospital o Stevens clnica. Le darn analgsicos para Financial controller, si es necesario. Es posible que le coloquen un drenaje en Stevens incisin. Se lo retirarn [employer] despus del procedimiento. Resumen Stevens colecistectoma mnimamente invasiva, tambin llamada colecistectoma laparoscpica, es una ciruga que se realiza para extirpar Stevens vescula biliar a travs de pequeas incisiones. Informe a su mdico sobre todas las otras afecciones que tenga y [place] todos los medicamentos que est usando para dichas afecciones. Antes del procedimiento, siga las instrucciones sobre cundo dejar de comer y beber y Christene Slates cambiar o suspender medicamentos. Haga que un adulto responsable lo cuide durante el tiempo que le indiquen despus de que le den el alta del hospital o de Stevens clnica. Esta informacin no tiene Marine scientist el consejo del mdico. Asegrese de hacerle al mdico cualquier pregunta que tenga. Document Revised: 11/28/2020 Document Reviewed: 11/28/2020 Elsevier Patient Education  [place].

## 2022-04-15 ENCOUNTER — Telehealth: Payer: Self-pay

## 2022-04-15 NOTE — Telephone Encounter (Signed)
Left detailed message -labs are better-however continue with MRCP and keep follow up appointment with Dr.Pabon.

## 2022-04-15 NOTE — Progress Notes (Signed)
Patient ID: Kristie Stevens, female   DOB: 11-13-1980, 41 y.o.   MRN: [id number]  HPI Kristie Stevens is a 41 y.o. female  seen in consultation at the request of Dr. Larinda Buttery. She presented to the emergency room weeks ago complaining of abdominal pain vomiting and diarrhea.  She reports that the pain was in the epigastric area and right upper quadrant.  Pain was intermittent moderate on dull in nature.  No fevers no chills no evidence of biliary obstruction. She is able to perform more than 4 METS of activity without any shortness of breath or chest pain.  She also had a history of C-section, bilateral augmentation mammoplasty and abdominoplasty The emergency room she did have Bashan of the AST and ALT was elevation of the total bilirubin to 1.5.  CBC and lipase were completely normal Pacifically she denies any fevers chills or evidence of biliary obstruction or cholangitis    HPI  Past Medical History:  Diagnosis Date   Acute blood loss anemia 07/06/2019   Anxiety    Gestational diabetes    Miscarriage     Past Surgical History:  Procedure Laterality Date   AUGMENTATION MAMMAPLASTY Bilateral 2015   gel implants   CESAREAN SECTION     CESAREAN SECTION N/A 05/21/2018   Procedure: CESAREAN SECTION;  Surgeon: Christeen Douglas, MD;  Location: [facility] ORS;  Service: Obstetrics;  Laterality: N/A;  Time of Birth: [birth year] Sex: Female Weight: 7 lb 14 oz    CESAREAN SECTION N/A 07/05/2019   Procedure: REPEAT CESAREAN SECTION;  Surgeon: Feliberto Gottron, Ihor Austin, MD;  Location: [facility] ORS;  Service: Obstetrics;  Laterality: N/A;   DILATION AND CURETTAGE OF UTERUS      Family History  Problem Relation Age of Onset   Hyperlipidemia Father    Hyperlipidemia Brother    Diabetes Paternal Grandmother     Social History Social History   Tobacco Use   Smoking status: Never   Smokeless tobacco: Never  Vaping Use   Vaping Use: Never used  Substance Use Topics   Alcohol use: Not Currently     Alcohol/week: 0.0 standard drinks of alcohol    Comment: occ   Drug use: No    No Known Allergies  Current Outpatient Medications  Medication Sig Dispense Refill   acetaminophen (TYLENOL) 500 MG tablet Take 2 tablets (1,000 mg total) by mouth every 6 (six) hours. 65 tablet 1   busPIRone (BUSPAR) 5 MG tablet Take 5 mg by mouth 2 (two) times daily.     ibuprofen (ADVIL) 800 MG tablet Take 1 tablet (800 mg total) by mouth every 8 (eight) hours as needed for mild pain or moderate pain. 30 tablet 0   ondansetron (ZOFRAN-ODT) 4 MG disintegrating tablet Take 1 tablet (4 mg total) by mouth every 8 (eight) hours as needed for nausea or vomiting. 20 tablet 0   oxyCODONE (OXY IR/ROXICODONE) 5 MG immediate release tablet Take 1-2 tablets (5-10 mg total) by mouth every 4 (four) hours as needed for moderate pain. 30 tablet 0   oxyCODONE-acetaminophen (PERCOCET/ROXICET) 5-325 MG tablet Take 1 tablet by mouth every 4 (four) hours as needed for severe pain. 15 tablet 0   PRENATAL 28-0.8 MG TABS Take 1 tablet by mouth daily.     ferrous sulfate 325 (65 FE) MG tablet Take 1 tablet (325 mg total) by mouth 2 (two) times daily with a meal.     No current facility-administered medications for this visit.     Review of  Systems Full ROS  was asked and was negative except for the information on the HPI  Physical Exam Blood pressure 114/74, pulse 69, temperature 98.6 F (37 C), temperature source Oral, height 5\' 2"  (1.575 m), weight 181 lb 9.6 oz (82.4 kg), last menstrual period 03/09/2022, SpO2 97 %, unknown if currently breastfeeding. CONSTITUTIONAL: NAD EYES: Pupils are equal, round, and reactive to light, Sclera are non-icteric. EARS, NOSE, MOUTH AND THROAT: The oral mucosa is pink and moist. Hearing is intact to voice. LYMPH NODES:  Lymph nodes in the neck are normal. RESPIRATORY:  Lungs are clear. There is normal respiratory effort, with equal breath sounds bilaterally, and without pathologic use of  accessory muscles. CARDIOVASCULAR: Heart is regular without murmurs, gallops, or rubs. GI: The abdomen is  soft, nontender, and nondistended. There are no palpable masses. There is no hepatosplenomegaly. There are normal bowel sounds in all quadrants. GU: Rectal deferred.   MUSCULOSKELETAL: Normal muscle strength and tone. No cyanosis or edema.   SKIN: Turgor is good and there are no pathologic skin lesions or ulcers. NEUROLOGIC: Motor and sensation is grossly normal. Cranial nerves are grossly intact. PSYCH:  Oriented to person, place and time. Affect is normal.  Data Reviewed  I have personally reviewed the patient's imaging, laboratory findings and medical records.    Assessment/Plan 41 year old female with evidence of symptomatic cholelithiasis and evidence of increase in LFTs.  I will like to repeat LFTs I am concerned for the potential for choledocholithiasis.  Also we will go ahead and start workup for an MRCP.  She may need ERCP before cholecystectomy.  At some point in time she will need cholecystectomy and the question is the timing.  I do think that the first order of business is to clarify whether or not she is got an obstructive process.  I had an extensive discussion with the patient regarding her disease process. I have also discussed with her in detail the role of cholecystectomy The risks, benefits, complications, treatment options, and expected outcomes were discussed with the patient. The possibilities of bleeding, recurrent infection, finding a normal gallbladder, perforation of viscus organs, damage to surrounding structures, bile leak, abscess formation, needing a drain placed, the need for additional procedures, reaction to medication, pulmonary aspiration,  failure to diagnose a condition, the possible need to convert to an open procedure, and creating a complication requiring transfusion or operation were discussed with the patient. The patient and/or family concurred with  the proposed plan, giving informed consent.  Will see her back once she completes her appropriate workup and set her up for potential cholecystectomy. Currently not toxic and does not need hospitalization at this time Please note that I spent 60 minutes in this encounter including personally reviewing imaging studies, coordinating her care, placing orders and performing appropriate  documentation  46, MD FACS General Surgeon 04/15/2022, 2:05 PM

## 2022-04-26 ENCOUNTER — Ambulatory Visit
Admission: RE | Admit: 2022-04-26 | Discharge: 2022-04-26 | Disposition: A | Discharge status: Home / Self Care | Payer: Medicaid Other | Source: Ambulatory Visit | Attending: Surgery | Admitting: Surgery

## 2022-04-26 DIAGNOSIS — K8020 Calculus of gallbladder without cholecystitis without obstruction: Secondary | ICD-10-CM

## 2022-04-28 ENCOUNTER — Ambulatory Visit (INDEPENDENT_AMBULATORY_CARE_PROVIDER_SITE_OTHER): Payer: Medicaid Other | Admitting: Surgery

## 2022-04-28 ENCOUNTER — Encounter: Payer: Self-pay | Admitting: Surgery

## 2022-04-28 VITALS — BP 122/82 | HR 80 | Temp 99.0°F | Wt 182.8 lb

## 2022-04-28 DIAGNOSIS — K8020 Calculus of gallbladder without cholecystitis without obstruction: Secondary | ICD-10-CM | POA: Diagnosis not present

## 2022-04-28 DIAGNOSIS — K8050 Calculus of bile duct without cholangitis or cholecystitis without obstruction: Secondary | ICD-10-CM

## 2022-04-28 DIAGNOSIS — R7989 Other specified abnormal findings of blood chemistry: Secondary | ICD-10-CM | POA: Diagnosis not present

## 2022-04-28 NOTE — H&P (View-Only) (Signed)
Outpatient Surgical Follow Up  04/28/2022  Kristie Stevens is an 41 y.o. female.   Chief Complaint  Patient presents with   Follow-up    Cholelithiasis     HPI: The is a 41 year old female well-known to me with history of intermittent right upper quadrant pain nausea consistent with biliary colic.  She did have elevation of the LFTs that I repeated.  There was improvement of the LFTs and the T. bili however there was mild persistent elevation.  I decided to perform an MRCP but unfortunately patient developed a panic attack and was unable to complete that.  She continues to have some intermittent pain and attacks but they are milder.  No evidence of fevers no evidence of cholangitis no evidence of biliary obstruction.  CMP revealed elevation of the AST to 69 and ALT to 105.  The last rest of the as LFTs were normal. CBC nml SHe is able to perform more than 4 METS of activity without any shortness of breath or chest pain.  Past Medical History:  Diagnosis Date   Acute blood loss anemia 07/06/2019   Anxiety    Gestational diabetes    Miscarriage     Past Surgical History:  Procedure Laterality Date   AUGMENTATION MAMMAPLASTY Bilateral 2015   gel implants   CESAREAN SECTION     CESAREAN SECTION N/A 05/21/2018   Procedure: CESAREAN SECTION;  Surgeon: Christeen Douglas, MD;  Location: [facility] ORS;  Service: Obstetrics;  Laterality: N/A;  Time of Birth: [birth year] Sex: Female Weight: 7 lb 14 oz    CESAREAN SECTION N/A 07/05/2019   Procedure: REPEAT CESAREAN SECTION;  Surgeon: Feliberto Gottron, Ihor Austin, MD;  Location: [facility] ORS;  Service: Obstetrics;  Laterality: N/A;   DILATION AND CURETTAGE OF UTERUS      Family History  Problem Relation Age of Onset   Hyperlipidemia Father    Hyperlipidemia Brother    Diabetes Paternal Grandmother     Social History:  reports that she has never smoked. She has never used smokeless tobacco. She reports that she does not currently use alcohol. She reports  that she does not use drugs.  Allergies: No Known Allergies  Medications reviewed.    ROS Full ROS performed and is otherwise negative other than what is stated in HPI   BP 122/82   Pulse 80   Temp 99 F (37.2 C) (Oral)   Wt 182 lb 12.8 oz (82.9 kg)   SpO2 97%   BMI 33.43 kg/m   Physical Exam CONSTITUTIONAL: NAD EYES: Pupils are equal, round, and reactive to light, Sclera are non-icteric. EARS, NOSE, MOUTH AND THROAT: The oral mucosa is pink and moist. Hearing is intact to voice. LYMPH NODES:  Lymph nodes in the neck are normal. RESPIRATORY:  Lungs are clear. There is normal respiratory effort, with equal breath sounds bilaterally, and without pathologic use of accessory muscles. CARDIOVASCULAR: Heart is regular without murmurs, gallops, or rubs. GI: The abdomen is  soft, nontender, and nondistended. There are no palpable masses. There is no hepatosplenomegaly. There are normal bowel sounds in all quadrants. GU: Rectal deferred.   MUSCULOSKELETAL: Normal muscle strength and tone. No cyanosis or edema.   SKIN: Turgor is good and there are no pathologic skin lesions or ulcers. NEUROLOGIC: Motor and sensation is grossly normal. Cranial nerves are grossly intact. PSYCH:  Oriented to person, place and time. Affect is normal.     Assessment/Plan: Biliary colic with elevation of LFTs cannot rule out choledocholithiasis.  She  does not have any signs of cholangitis.  I will repeat LFTs and an ultrasound of the right upper quadrant given that she was unable to complete MRCP.  If LFTs have normalized and there is no evidence of biliary dilation I do think that we can proceed with robotic cholecystectomy. The risks, benefits, complications, treatment options, and expected outcomes were discussed with the patient. The possibilities of bleeding, recurrent infection, finding a normal gallbladder, perforation of viscus organs, damage to surrounding structures, bile leak, abscess formation,  needing a drain placed, the need for additional procedures, reaction to medication, pulmonary aspiration,  failure to diagnose a condition, the possible need to convert to an open procedure, and creating a complication requiring transfusion or operation were discussed with the patient. The patient and/or family concurred with the proposed plan, giving informed consent.   I have spent 40 minutes in this encounter including pcounseling the patient ,coordinating her care, placing orders and performing appropriate documentation      Sterling Big, MD [facility] General Surgeon

## 2022-04-28 NOTE — Patient Instructions (Addendum)
Your Ultrasound is scheduled for 05/01/2022 at 9 am (arrive by 8:45 am) @ [facility]. Nothing to eat or drink after midnight.   Please get your labs done at [facility] entrance.  If you have any concerns or questions, please feel free to call our office.   [name]   [name] ocurre cuando se forman clculos biliares en la vescula biliar. La vescula biliar almacena bilis. La bilis es un lquido que ayuda a [employer]. La bilis puede endurecerse y transformarse en clculos biliares. Si estos causan una obstruccin, Magazine features editor (ataque de vescula biliar). Cules son las causas? Esta afeccin puede ser causada por lo siguiente: Algunas enfermedades de la sangre, como la anemia drepanoctica. Demasiada cantidad de una sustancia parecida a la grasa (colesterol) en la bilis. No tener suficiente cantidad de sales biliares en la bilis. Estas sales le ayudan al organismo a Environmental health practitioner y a Mining engineer. La vescula biliar no se vaca completamente o con suficiente frecuencia. Esto es frecuente en las mujeres embarazadas. Qu incrementa el riesgo? Los siguientes factores pueden hacer que sea ms propenso a Clinical cytogeneticist afeccin: Ser mujer. Estar embarazada muchas veces. Comer muchos alimentos fritos, grasas y carbohidratos refinados. Tener mucho sobrepeso (obesidad). Ser mayor de 40 aos de edad. Usar medicamentos con hormonas femeninas durante [place]. Adelgazar rpidamente. Tener clculos biliares en la familia. Tener algunos problemas de [place], como diabetes, enfermedad de Crohn o enfermedad heptica. Cules son los signos o sntomas? A menudo, puede haber clculos biliares, pero sin sntomas. Estos clculos biliares se denominan clculos silenciosos. Si un clculo biliar provoca una obstruccin, puede sentir dolor repentino. El dolor: Puede estar en la parte superior derecha del vientre (abdomen). Normalmente aparece a  la noche o despus de comer. Puede durar Neomia Dear hora o ms. Se puede extender hacia el hombro derecho, la espalda o el pecho. Puede sentirse Radiographer, therapeutic, ardor o sensacin de plenitud en la parte superior del vientre (indigestin). Si la obstruccin dura ms de algunas horas, puede tener una infeccin o hinchazn. Usted puede: Sentir que va a vomitar. Vomitar. Sentirse hinchado. Tener dolor en el vientre durante 5 horas o ms. Sentir dolor con la palpacin en el vientre, a menudo en la parte superior derecha y debajo de las [place]. Tener fiebre o escalofros. Notar que la piel o la zona blanca de los ojos se vuelven amarillas (ictericia). Tener el pis (orina) oscuro o las deposiciones (heces) plidas. Cmo se trata? El tratamiento de esta afeccin depende de qu tan mal se sienta. Si tiene sntomas, puede necesitar lo siguiente: Medical laboratory scientific officer, si los sntomas no son Lynnae Sandhoff graves. No coma durante 12 a 24 horas. Beba solamente agua y lquidos claros. Comience a comer alimentos simples o claros despus de 1 o 2 das. Pruebe con caldos y galletas. Es posible que necesite medicamentos para el dolor o Counsellor, o ambos. Si tiene una infeccin, necesitar antibiticos. Hospitalizacin, si tiene un dolor muy intenso o una infeccin muy grave. Ciruga para extirpar la vescula biliar. Puede ser necesario si: Los clculos biliares siguen apareciendo. Tiene sntomas muy graves. Medicamentos para destruir los clculos biliares. Medicamentos: Son mejores para los clculos pequeos. Pueden usarse durante hasta 6 a 12 meses. Un procedimiento para encontrar y extraer los clculos biliares o para fragmentarlos. Siga estas instrucciones en su casa: Medicamentos Use los medicamentos de venta libre y los recetados solamente como se lo haya indicado el mdico. Si le recetaron un  antibitico, tmelo como se lo haya indicado el mdico. No deje de tomar el antibitico aunque comience a  sentirse mejor. Pregntele al mdico si el medicamento recetado le impide conducir o usar [place]. Comida y bebida Beba suficiente lquido como para Theatre manager la orina de color amarillo plido. Beba agua o lquidos claros. Esto es importante cuando Tree surgeon. Consuma alimentos saludables. Elija: Menos alimentos grasos, como las comidas fritas. Menos carbohidratos refinados. Evite los panes y los cereales muy procesados, como el pan blanco y el arroz blanco. Elija cereales integrales, como el pan integral y Dwyane Luo integral. Ms Fredderick [place]. Las [place], las frutas frescas y los frijoles son fuentes saludables. Instrucciones generales Mantenga un peso saludable. Concurra a todas las visitas de seguimiento como se lo haya indicado el mdico. Esto es importante. Dnde buscar ms informacin [employer] of Diabetes and Digestive and Kidney Diseases ([place] Diabetes y las Enfermedades Digestivas y Renales): [email or web address] Comunquese con un mdico si: Siente dolor repentino en el costado superior derecho del vientre. El dolor podra extenderse hasta el hombro derecho, la espalda o el pecho. Le han diagnosticado clculos biliares que no presentan sntomas y tiene lo siguiente: Dolor abdominal. Molestias, ardor o sensacin de plenitud en la parte superior del abdomen. La orina es de color oscuro o tiene heces plidas. Solicite ayuda de inmediato si: Tiene dolor repentino en la parte superior derecha del abdomen y el dolor dura ms de 2 horas. Tiene dolor en el abdomen y: Dura ms de 5 horas. Sigue empeorando. Tiene fiebre o escalofros. Tiene ganas continuas de vomitar. Sigue vomitando. La piel y la parte blanca de los ojos se ponen amarillos. Resumen [name] ocurre cuando se forman clculos biliares en la vescula biliar. La causa de esta afeccin puede ser [name] enfermedad de la [place], una cantidad excesiva de una sustancia parecida a la grasa en la bilis  o una cantidad insuficiente de sales biliares. El tratamiento de esta afeccin depende de qu tan mal se sienta. Si tiene sntomas, no coma ni beba. Es posible que necesite tomar medicamentos. Tal vez necesite hospitalizacin si tiene un dolor muy intenso o una infeccin muy grave. Es posible que deba someterse a una ciruga si los clculos siguen apareciendo o si tiene sntomas muy graves. Esta informacin no tiene Marine scientist el consejo del mdico. Asegrese de hacerle al mdico cualquier pregunta que tenga. Document Revised: 06/17/2019 Document Reviewed: 06/17/2019 Elsevier Patient Education  [place].

## 2022-04-28 NOTE — Progress Notes (Signed)
Outpatient Surgical Follow Up  04/28/2022  Kristie Stevens is an 41 y.o. female.   Chief Complaint  Patient presents with   Follow-up    Cholelithiasis     HPI: The is a 41 year old female well-known to me with history of intermittent right upper quadrant pain nausea consistent with biliary colic.  She did have elevation of the LFTs that I repeated.  There was improvement of the LFTs and the T. bili however there was mild persistent elevation.  I decided to perform an MRCP but unfortunately patient developed a panic attack and was unable to complete that.  She continues to have some intermittent pain and attacks but they are milder.  No evidence of fevers no evidence of cholangitis no evidence of biliary obstruction.  CMP revealed elevation of the AST to 69 and ALT to 105.  The last rest of the as LFTs were normal. CBC nml SHe is able to perform more than 4 METS of activity without any shortness of breath or chest pain.  Past Medical History:  Diagnosis Date   Acute blood loss anemia 07/06/2019   Anxiety    Gestational diabetes    Miscarriage     Past Surgical History:  Procedure Laterality Date   AUGMENTATION MAMMAPLASTY Bilateral 2015   gel implants   CESAREAN SECTION     CESAREAN SECTION N/A 05/21/2018   Procedure: CESAREAN SECTION;  Surgeon: Christeen Douglas, MD;  Location: [facility] ORS;  Service: Obstetrics;  Laterality: N/A;  Time of Birth: [birth year] Sex: Female Weight: 7 lb 14 oz    CESAREAN SECTION N/A 07/05/2019   Procedure: REPEAT CESAREAN SECTION;  Surgeon: Feliberto Gottron, Ihor Austin, MD;  Location: [facility] ORS;  Service: Obstetrics;  Laterality: N/A;   DILATION AND CURETTAGE OF UTERUS      Family History  Problem Relation Age of Onset   Hyperlipidemia Father    Hyperlipidemia Brother    Diabetes Paternal Grandmother     Social History:  reports that she has never smoked. She has never used smokeless tobacco. She reports that she does not currently use alcohol. She reports  that she does not use drugs.  Allergies: No Known Allergies  Medications reviewed.    ROS Full ROS performed and is otherwise negative other than what is stated in HPI   BP 122/82   Pulse 80   Temp 99 F (37.2 C) (Oral)   Wt 182 lb 12.8 oz (82.9 kg)   SpO2 97%   BMI 33.43 kg/m   Physical Exam CONSTITUTIONAL: NAD EYES: Pupils are equal, round, and reactive to light, Sclera are non-icteric. EARS, NOSE, MOUTH AND THROAT: The oral mucosa is pink and moist. Hearing is intact to voice. LYMPH NODES:  Lymph nodes in the neck are normal. RESPIRATORY:  Lungs are clear. There is normal respiratory effort, with equal breath sounds bilaterally, and without pathologic use of accessory muscles. CARDIOVASCULAR: Heart is regular without murmurs, gallops, or rubs. GI: The abdomen is  soft, nontender, and nondistended. There are no palpable masses. There is no hepatosplenomegaly. There are normal bowel sounds in all quadrants. GU: Rectal deferred.   MUSCULOSKELETAL: Normal muscle strength and tone. No cyanosis or edema.   SKIN: Turgor is good and there are no pathologic skin lesions or ulcers. NEUROLOGIC: Motor and sensation is grossly normal. Cranial nerves are grossly intact. PSYCH:  Oriented to person, place and time. Affect is normal.     Assessment/Plan: Biliary colic with elevation of LFTs cannot rule out choledocholithiasis.  She  does not have any signs of cholangitis.  I will repeat LFTs and an ultrasound of the right upper quadrant given that she was unable to complete MRCP.  If LFTs have normalized and there is no evidence of biliary dilation I do think that we can proceed with robotic cholecystectomy. The risks, benefits, complications, treatment options, and expected outcomes were discussed with the patient. The possibilities of bleeding, recurrent infection, finding a normal gallbladder, perforation of viscus organs, damage to surrounding structures, bile leak, abscess formation,  needing a drain placed, the need for additional procedures, reaction to medication, pulmonary aspiration,  failure to diagnose a condition, the possible need to convert to an open procedure, and creating a complication requiring transfusion or operation were discussed with the patient. The patient and/or family concurred with the proposed plan, giving informed consent.   I have spent 40 minutes in this encounter including pcounseling the patient ,coordinating her care, placing orders and performing appropriate documentation      Sterling Big, MD [facility] General Surgeon

## 2022-04-29 ENCOUNTER — Telehealth: Payer: Self-pay | Admitting: Surgery

## 2022-04-29 NOTE — Telephone Encounter (Signed)
Left message for patient to call, please inform her of the following regarding scheduled surgery.   Pre-Admission date/time, and Surgery date at [facility].  Surgery Date: 05/08/22 Preadmission Testing Date: 05/02/22 (phone 1p-5p)  Also patient will need to call at [phone], between 1-3:00pm the day before surgery, to find out what time to arrive for surgery.    

## 2022-05-01 ENCOUNTER — Ambulatory Visit
Admission: RE | Admit: 2022-05-01 | Discharge: 2022-05-01 | Disposition: A | Discharge status: Home / Self Care | Payer: Medicaid Other | Source: Ambulatory Visit | Attending: Surgery | Admitting: Surgery

## 2022-05-01 ENCOUNTER — Other Ambulatory Visit
Admission: RE | Admit: 2022-05-01 | Discharge: 2022-05-01 | Disposition: A | Discharge status: Home / Self Care | Payer: Medicaid Other | Source: Ambulatory Visit | Attending: Surgery | Admitting: Surgery

## 2022-05-01 DIAGNOSIS — K8020 Calculus of gallbladder without cholecystitis without obstruction: Secondary | ICD-10-CM | POA: Diagnosis present

## 2022-05-01 LAB — COMPREHENSIVE METABOLIC PANEL
ALT: 116 U/L — ABNORMAL HIGH (ref 0–44)
AST: 78 U/L — ABNORMAL HIGH (ref 15–41)
Albumin: 4 g/dL (ref 3.5–5.0)
Alkaline Phosphatase: 80 U/L (ref 38–126)
Anion gap: 7 (ref 5–15)
BUN: 11 mg/dL (ref 6–20)
CO2: 23 mmol/L (ref 22–32)
Calcium: 8.7 mg/dL — ABNORMAL LOW (ref 8.9–10.3)
Chloride: 107 mmol/L (ref 98–111)
Creatinine, Ser: 0.59 mg/dL (ref 0.44–1.00)
GFR, Estimated: >60 mL/min (ref >60)
Glucose, Bld: 136 mg/dL — ABNORMAL HIGH (ref 70–99)
Potassium: 3.9 mmol/L (ref 3.5–5.1)
Sodium: 137 mmol/L (ref 135–145)
Total Bilirubin: 0.7 mg/dL (ref 0.3–1.2)
Total Protein: 7.2 g/dL (ref 6.5–8.1)

## 2022-05-01 NOTE — Telephone Encounter (Signed)
Patient notified of Ultrasound and labs. Per Dr.Pabon all are ok.

## 2022-05-01 NOTE — Telephone Encounter (Signed)
Another message left for patient to call

## 2022-05-02 ENCOUNTER — Encounter
Admission: RE | Admit: 2022-05-02 | Discharge: 2022-05-02 | Disposition: A | Discharge status: Home / Self Care | Payer: Medicaid Other | Source: Ambulatory Visit | Attending: Surgery | Admitting: Surgery

## 2022-05-02 VITALS — Ht 62.0 in | Wt 182.0 lb

## 2022-05-02 DIAGNOSIS — Z01812 Encounter for preprocedural laboratory examination: Secondary | ICD-10-CM

## 2022-05-02 HISTORY — DX: Prediabetes: R73.03

## 2022-05-02 NOTE — Patient Instructions (Addendum)
Your procedure is scheduled on: Thursday May 08, 2022. Su procedimiento est programado para: Jueves 14 de Diciembre del 2023. Report to [facility] 2[facility], stop by registration desk before getting on elevator.  Presntese a: Surveyor, minerals [facility] 2ndo piso, registrese primero en el escritorio a su derecha al entrar al edificio. To find out your arrival time please call [phone] between 1PM - 3PM on Wednesday May 07, 2022. Para saber su hora de llegada por favor llame al [phone] entre la 1PM - 3PM el da: Miercoles 13 de Diciembre del 2023.  Remember: Instructions that are not followed completely may result in serious medical risk, up to and including death,  or upon the discretion of your surgeon and anesthesiologist your surgery may need to be rescheduled.  Recuerde: Las instrucciones que no se siguen completamente Armed forces logistics/support/administrative officer en un riesgo de salud grave, incluyendo hasta  la [place] o a discrecin de su cirujano y Scientific laboratory technician, su ciruga se puede posponer.   __X_ 1.Do not eat food after midnight the night before your procedure. No    gum chewing or hard candies. You may drink clear liquids up to 2 hours     before you are scheduled to arrive for your surgery- DO not drink clear     Liquids within 2 hours of the start of your surgery.     Clear Liquids include:    water, apple juice without pulp, clear carbohydrate drink such as    Clearfast of [place], Black Coffee or Tea (Do not add anything to coffee or tea).      No coma nada despus de la medianoche de la noche anterior a su    procedimiento. No coma chicles ni caramelos duros. Puede tomar    lquidos claros hasta 2 horas antes de su hora programada de llegada al     hospital para su procedimiento. No tome lquidos claros durante el     transcurso de las 2 horas de su llegada programada al hospital para su     procedimiento, ya que esto puede llevar a que su  procedimiento se    retrase o tenga que volver a Magazine features editor.  Los lquidos claros incluyen:          - Agua o jugo de [place] sin pulpa          - Bebidas claras con carbohidratos como ClearFast o Gatorade          - Caf negro o t claro (sin leche, sin cremas, no agregue nada al caf ni al t)  No tome nada que no est en esta lista.  Los pacientes con diabetes tipo 1 y tipo 2 solo deben Printmaker.  Llame a la clnica de PreCare o a la unidad de Same Day Surgery si  tiene alguna pregunta sobre estas instrucciones.               _X__ 2.Do Not Smoke or use e-cigarettes For 24 Hours Prior to Your Surgery.    Do not use any chewable tobacco products for at least 6   hours prior to surgery.    No fume ni use cigarrillos electrnicos durante las 24 horas previas    a su [place].  No use ningn producto de tabaco masticable durante   al menos 6 horas antes de la [place].     __X_ 3. No alcohol for 24 hours before or after surgery.    No tome alcohol  durante las 24 horas antes ni despus de la [place].   __X__4. On the morning of surgery brush your teeth with toothpaste and water, you                may rinse your mouth with mouthwash if you wish.  Do not swallow any toothpaste of mouthwash.   En la maana de la [place], cepllese los dientes con pasta de dientes y [place],                [place] enjuagarse la boca con enjuague bucal si lo desea. No ingiera ninguna pasta de dientes o enjuague bucal.   __X__ 5. Notify your doctor if there is any change in your medical condition (cold,fever, infections).    Informe a su mdico si hay algn cambio en su condicin mdica  (resfriado, fiebre, infecciones).   Do not wear jewelry, make-up, hairpins, clips or nail polish.  No use joyas, maquillajes, pinzas/ganchos para el cabello ni esmalte de uas.  Do not wear lotions, powders, or perfumes. You may wear deodorant.  No use lociones, polvos o perfumes.  Puede usar desodorante.    Do not shave 48  hours prior to surgery. Men may shave face and neck.  No se afeite 48 horas antes de la [place].    Do not bring valuables to the hospital.   No lleve objetos [place] is not responsible for any belongings or valuables.  [place] no se hace responsable de ningn tipo de pertenencias u objetos de Geographical information systems officer.               Contacts, dentures or bridgework may not be worn into surgery.  Los lentes de [place], las dentaduras postizas o puentes no se pueden usar en la [place].   Leave your suitcase in the car. After surgery it may be brought to your room.  Deje su maleta en el auto.  Despus de la ciruga podr traerla a su habitacin.   For patients admitted to the hospital, discharge time is determined by your  treatment team.  Para los pacientes que sean ingresados al hospital, el tiempo en el cual se le  dar de alta es determinado por su equipo de [place].   Patients discharged the day of surgery will not be allowed to drive home. A los pacientes que se les da de alta el mismo da de la ciruga no se les permitir conducir a Holiday representative.  __X__ Take these medicines the morning of surgery with A SIP OF WATER:          [employer] estas medicinas la maana de la ciruga con UN SORBO DE AGUA:  1. None   2.   3.   4.       5.  6.  ____ Fleet Enema (as directed)          Enema de Fleet (segn lo indicado)    __X__ Use CHG Soap as directed          Utilice el jabn de CHG segn lo indicado  ____ Use inhalers on the day of surgery          Use los inhaladores el da de la ciruga  ____ Stop metformin 2 days prior to surgery          Deje de tomar el metformin 2 das antes de la ciruga    ____ Take 1/2 of usual insulin dose the night before surgery and none on  the morning of surgery           Tome la mitad de la dosis habitual de insulina la noche antes de la [place] y no tome nada en la maana de la             ciruga  __X__ Stop Anti-inflammatories such as  Ibuprofen, Aleve, Advil, Motrin, Naprosyn, Meloxicam, Lodine, Ketoralac, Midol, and aspirin containing products like Excedrin, Goody's and or BC Powders.          Deje de tomar antiinflamatorios como Ibuprofen, Aleve, Advil, Motrin, Naprosyn, Meloxicam, Lodine, Ketoralac, Midol, o productos con aspirina como Excedrin, Goody's and or BC Powders.   __X__ Stop supplements until after surgery            Deje de tomar suplementos hasta despus de la ciruga  ____ Bring C-Pap to the hospital          Lleve el C-Pap al hospital   If you have any questions regarding your pre-procedure instructions,  Please call Pre-admit Testing at [phone]  Si tiene alguna pregunta con respecto a las instrucciones previas al procedimiento, Llame a [place] previas a la admisin al [phone]  Preparacin para la ciruga con jabn de GLUCONATO DE CLORHEXIDINA (CHG)  Jabn de gluconato de clorhexidina (CHG)  o Un limpiador antisptico que [employer] grmenes y se adhiere a la piel para seguir [employer] grmenes incluso despus del lavado.  o Se utiliza para ducharse la noche anterior a la [place] y la maana de la [place].  Antes de la [place], usted puede desempear un papel importante al reducir la cantidad de grmenes en su piel. El jabn CHG (gluconato de clorhexidina) es un limpiador antisptico que mata los grmenes y se adhiere a la piel para continuar matndolos incluso despus del lavado.  No lo utilice si es alrgico al CHG o a los jabones antibacterianos. Si su piel se enrojece o irrita, deje de usar CHG.  1. Ducharse la NOCHE ANTES DE LA CIRUGA y la [place] DE LA CIRUGA con jabn CHG.  2. Si eliges lavarte el cabello, lvalo primero como de costumbre con tu champ habitual.  3. Despus del champ, enjuague bien el cabello y el cuerpo para eliminar el champ.  4. Utilice CHG como lo hara con cualquier otro jabn lquido. Puede aplicar CHG directamente sobre la piel y lavar suavemente con un  pauelo o una toallita limpia.  5. Aplique el jabn CHG en su cuerpo nicamente desde el cuello hacia abajo. No utilizar en heridas abiertas o llagas abiertas. Evite el contacto con los ojos, odos, boca y genitales (partes privadas). Lvese la cara y los genitales (partes privadas) con su jabn habitual.  6. Lvese bien, prestando especial atencin al rea donde se realizar su ciruga.  7. Enjuague bien su cuerpo con agua tibia.  8. No se duche ni se lave con su jabn normal despus de usar y enjuagar el jabn CHG.  9. Squese dando palmaditas con una toalla limpia.  10. Use pijamas limpios para dormir la noche anterior a la ciruga.  12. Coloque sbanas limpias en su cama la noche de su primera ducha y no duerma con mascotas.  13. Ducharse nuevamente con el jabn CHG el da de la ciruga antes de llegar al hospital.  14. No aplique desodorantes, lociones o polvos.  15. Por favor use ropa limpia al hospital.

## 2022-05-08 ENCOUNTER — Ambulatory Visit
Admission: RE | Admit: 2022-05-08 | Discharge: 2022-05-08 | Disposition: A | Discharge status: Home / Self Care | Payer: Medicaid Other | Attending: Surgery | Admitting: Surgery

## 2022-05-08 ENCOUNTER — Ambulatory Visit: Payer: Medicaid Other | Admitting: General Practice

## 2022-05-08 ENCOUNTER — Encounter: Payer: Self-pay | Admitting: Surgery

## 2022-05-08 ENCOUNTER — Other Ambulatory Visit: Payer: Self-pay

## 2022-05-08 ENCOUNTER — Encounter
Admission: RE | Disposition: A | Discharge status: Home / Self Care | Payer: Self-pay | Source: Home / Self Care | Attending: Surgery

## 2022-05-08 DIAGNOSIS — K8044 Calculus of bile duct with chronic cholecystitis without obstruction: Secondary | ICD-10-CM | POA: Diagnosis not present

## 2022-05-08 DIAGNOSIS — K8010 Calculus of gallbladder with chronic cholecystitis without obstruction: Secondary | ICD-10-CM | POA: Diagnosis not present

## 2022-05-08 DIAGNOSIS — Z87891 Personal history of nicotine dependence: Secondary | ICD-10-CM | POA: Insufficient documentation

## 2022-05-08 DIAGNOSIS — K8050 Calculus of bile duct without cholangitis or cholecystitis without obstruction: Secondary | ICD-10-CM

## 2022-05-08 DIAGNOSIS — Z01812 Encounter for preprocedural laboratory examination: Secondary | ICD-10-CM

## 2022-05-08 DIAGNOSIS — E119 Type 2 diabetes mellitus without complications: Secondary | ICD-10-CM | POA: Diagnosis not present

## 2022-05-08 LAB — POCT PREGNANCY, URINE: Preg Test, Ur: NEGATIVE

## 2022-05-08 SURGERY — CHOLECYSTECTOMY, ROBOT-ASSISTED, LAPAROSCOPIC
Anesthesia: General

## 2022-05-08 MED ORDER — ONDANSETRON HCL 4 MG/2ML IJ SOLN
INTRAMUSCULAR | Status: DC | PRN
  Administered 2022-05-08 (×2): 4 mg via INTRAVENOUS

## 2022-05-08 MED ORDER — FAMOTIDINE 20 MG PO TABS
ORAL_TABLET | ORAL | Status: AC
  Administered 2022-05-08: 20 mg via ORAL
  Filled 2022-05-08: qty 1

## 2022-05-08 MED ORDER — CEFAZOLIN SODIUM-DEXTROSE 2-4 GM/100ML-% IV SOLN
2.0000 g | INTRAVENOUS | Status: AC
  Administered 2022-05-08: 2 g via INTRAVENOUS

## 2022-05-08 MED ORDER — OXYCODONE HCL 5 MG PO TABS: 5.0000 mg | ORAL_TABLET | Freq: Once | ORAL | Status: AC | PRN

## 2022-05-08 MED ORDER — 0.9 % SODIUM CHLORIDE (POUR BTL) OPTIME
TOPICAL | Status: DC | PRN
  Administered 2022-05-08: 200 mL

## 2022-05-08 MED ORDER — ORAL CARE MOUTH RINSE: 15.0000 mL | Freq: Once | OROMUCOSAL | Status: AC

## 2022-05-08 MED ORDER — GABAPENTIN 300 MG PO CAPS: 300.0000 mg | ORAL_CAPSULE | ORAL | Status: AC

## 2022-05-08 MED ORDER — GABAPENTIN 300 MG PO CAPS
ORAL_CAPSULE | ORAL | Status: AC
  Administered 2022-05-08: 300 mg via ORAL
  Filled 2022-05-08: qty 1

## 2022-05-08 MED ORDER — FENTANYL CITRATE (PF) 100 MCG/2ML IJ SOLN: 25.0000 ug | INTRAMUSCULAR | Status: DC | PRN

## 2022-05-08 MED ORDER — PROPOFOL 10 MG/ML IV BOLUS
INTRAVENOUS | Status: DC | PRN
  Administered 2022-05-08: 160 mg via INTRAVENOUS

## 2022-05-08 MED ORDER — FENTANYL CITRATE (PF) 100 MCG/2ML IJ SOLN
INTRAMUSCULAR | Status: DC | PRN
  Administered 2022-05-08 (×2): 50 ug via INTRAVENOUS

## 2022-05-08 MED ORDER — MIDAZOLAM HCL 2 MG/2ML IJ SOLN
INTRAMUSCULAR | Status: AC
  Filled 2022-05-08: qty 2

## 2022-05-08 MED ORDER — CELECOXIB 200 MG PO CAPS: 200.0000 mg | ORAL_CAPSULE | ORAL | Status: AC

## 2022-05-08 MED ORDER — LIDOCAINE HCL (CARDIAC) PF 100 MG/5ML IV SOSY
PREFILLED_SYRINGE | INTRAVENOUS | Status: DC | PRN
  Administered 2022-05-08: 80 mg via INTRAVENOUS

## 2022-05-08 MED ORDER — MIDAZOLAM HCL 2 MG/2ML IJ SOLN
INTRAMUSCULAR | Status: DC | PRN
  Administered 2022-05-08: 2 mg via INTRAVENOUS

## 2022-05-08 MED ORDER — CHLORHEXIDINE GLUCONATE CLOTH 2 % EX PADS
6.0000 | MEDICATED_PAD | Freq: Once | CUTANEOUS | Status: AC
  Administered 2022-05-08: 6 via TOPICAL

## 2022-05-08 MED ORDER — CHLORHEXIDINE GLUCONATE 0.12 % MT SOLN: 15.0000 mL | Freq: Once | OROMUCOSAL | Status: AC

## 2022-05-08 MED ORDER — LACTATED RINGERS IV SOLN: INTRAVENOUS | Status: DC

## 2022-05-08 MED ORDER — CELECOXIB 200 MG PO CAPS
ORAL_CAPSULE | ORAL | Status: AC
  Administered 2022-05-08: 200 mg via ORAL
  Filled 2022-05-08: qty 1

## 2022-05-08 MED ORDER — FAMOTIDINE 20 MG PO TABS: 20.0000 mg | ORAL_TABLET | Freq: Once | ORAL | Status: AC

## 2022-05-08 MED ORDER — BUPIVACAINE-EPINEPHRINE (PF) 0.25% -1:200000 IJ SOLN
INTRAMUSCULAR | Status: DC | PRN
  Administered 2022-05-08: 40 mL
  Administered 2022-05-08: 10 mL

## 2022-05-08 MED ORDER — DEXAMETHASONE SODIUM PHOSPHATE 10 MG/ML IJ SOLN
INTRAMUSCULAR | Status: DC | PRN
  Administered 2022-05-08: 10 mg via INTRAVENOUS

## 2022-05-08 MED ORDER — ACETAMINOPHEN 500 MG PO TABS: 1000.0000 mg | ORAL_TABLET | ORAL | Status: AC

## 2022-05-08 MED ORDER — DEXMEDETOMIDINE HCL IN NACL 200 MCG/50ML IV SOLN
INTRAVENOUS | Status: DC | PRN
  Administered 2022-05-08: 12 ug via INTRAVENOUS

## 2022-05-08 MED ORDER — CEFAZOLIN SODIUM-DEXTROSE 2-4 GM/100ML-% IV SOLN
INTRAVENOUS | Status: AC
  Filled 2022-05-08: qty 100

## 2022-05-08 MED ORDER — INDOCYANINE GREEN 25 MG IV SOLR
2.5000 mg | Freq: Once | INTRAVENOUS | Status: AC
  Administered 2022-05-08: 2.5 mg via INTRAVENOUS
  Filled 2022-05-08: qty 1

## 2022-05-08 MED ORDER — ACETAMINOPHEN 500 MG PO TABS
ORAL_TABLET | ORAL | Status: AC
  Administered 2022-05-08: 1000 mg via ORAL
  Filled 2022-05-08: qty 2

## 2022-05-08 MED ORDER — ONDANSETRON HCL 4 MG/2ML IJ SOLN: 4.0000 mg | Freq: Once | INTRAMUSCULAR | Status: DC | PRN

## 2022-05-08 MED ORDER — HYDROCODONE-ACETAMINOPHEN 5-325 MG PO TABS: 1.0000 | ORAL_TABLET | ORAL | 0 refills | Status: DC | PRN

## 2022-05-08 MED ORDER — ROCURONIUM BROMIDE 100 MG/10ML IV SOLN
INTRAVENOUS | Status: DC | PRN
  Administered 2022-05-08: 50 mg via INTRAVENOUS

## 2022-05-08 MED ORDER — SUGAMMADEX SODIUM 200 MG/2ML IV SOLN
INTRAVENOUS | Status: DC | PRN
  Administered 2022-05-08: 400 mg via INTRAVENOUS

## 2022-05-08 MED ORDER — OXYCODONE HCL 5 MG/5ML PO SOLN: 5.0000 mg | Freq: Once | ORAL | Status: AC | PRN

## 2022-05-08 MED ORDER — CHLORHEXIDINE GLUCONATE 0.12 % MT SOLN
OROMUCOSAL | Status: AC
  Administered 2022-05-08: 15 mL via OROMUCOSAL
  Filled 2022-05-08: qty 15

## 2022-05-08 MED ORDER — OXYCODONE HCL 5 MG PO TABS
ORAL_TABLET | ORAL | Status: AC
  Administered 2022-05-08: 5 mg via ORAL
  Filled 2022-05-08: qty 1

## 2022-05-08 MED ORDER — FENTANYL CITRATE (PF) 100 MCG/2ML IJ SOLN
INTRAMUSCULAR | Status: AC
  Filled 2022-05-08: qty 2

## 2022-05-08 SURGICAL SUPPLY — 39 items
CANNULA REDUC XI 12-8 STAPL (CANNULA) ×1
CANNULA REDUCER 12-8 DVNC XI (CANNULA) ×1 IMPLANT
CLIP LIGATING HEMO O LOK GREEN (MISCELLANEOUS) ×1 IMPLANT
DERMABOND ADVANCED .7 DNX12 (GAUZE/BANDAGES/DRESSINGS) ×1 IMPLANT
DRAPE ARM DVNC X/XI (DISPOSABLE) ×4 IMPLANT
DRAPE COLUMN DVNC XI (DISPOSABLE) ×1 IMPLANT
DRAPE DA VINCI XI ARM (DISPOSABLE) ×4
DRAPE DA VINCI XI COLUMN (DISPOSABLE) ×1
ELECT CAUTERY BLADE 6.4 (BLADE) ×1 IMPLANT
ELECT REM PT RETURN 9FT ADLT (ELECTROSURGICAL) ×1
ELECTRODE REM PT RTRN 9FT ADLT (ELECTROSURGICAL) ×1 IMPLANT
GLOVE BIO SURGEON STRL SZ7 (GLOVE) ×2 IMPLANT
GOWN STRL REUS W/ TWL LRG LVL3 (GOWN DISPOSABLE) ×4 IMPLANT
GOWN STRL REUS W/TWL LRG LVL3 (GOWN DISPOSABLE) ×4
KIT PINK PAD W/HEAD ARE REST (MISCELLANEOUS) ×1
KIT PINK PAD W/HEAD ARM REST (MISCELLANEOUS) ×1 IMPLANT
LABEL OR SOLS (LABEL) ×1 IMPLANT
MANIFOLD NEPTUNE II (INSTRUMENTS) ×1 IMPLANT
NEEDLE HYPO 22GX1.5 SAFETY (NEEDLE) ×1 IMPLANT
NS IRRIG 500ML POUR BTL (IV SOLUTION) ×1 IMPLANT
OBTURATOR OPTICAL STANDARD 8MM (TROCAR) ×1
OBTURATOR OPTICAL STND 8 DVNC (TROCAR) ×1
OBTURATOR OPTICALSTD 8 DVNC (TROCAR) ×1 IMPLANT
PACK LAP CHOLECYSTECTOMY (MISCELLANEOUS) ×1 IMPLANT
PENCIL SMOKE EVACUATOR (MISCELLANEOUS) ×1 IMPLANT
SEAL CANN UNIV 5-8 DVNC XI (MISCELLANEOUS) ×3 IMPLANT
SEAL XI 5MM-8MM UNIVERSAL (MISCELLANEOUS) ×3
SET TUBE SMOKE EVAC HIGH FLOW (TUBING) ×1 IMPLANT
SOLUTION ELECTROLUBE (MISCELLANEOUS) ×1 IMPLANT
SPIKE FLUID TRANSFER (MISCELLANEOUS) ×1 IMPLANT
SPONGE T-LAP 18X18 ~~LOC~~+RFID (SPONGE) ×1 IMPLANT
STAPLER CANNULA SEAL DVNC XI (STAPLE) ×1 IMPLANT
STAPLER CANNULA SEAL XI (STAPLE) ×1
SUT MNCRL AB 4-0 PS2 18 (SUTURE) ×1 IMPLANT
SUT VICRYL 0 UR6 27IN ABS (SUTURE) ×2 IMPLANT
SYR 20ML LL LF (SYRINGE) ×1 IMPLANT
SYS BAG RETRIEVAL 10MM (BASKET) ×1
SYSTEM BAG RETRIEVAL 10MM (BASKET) ×1 IMPLANT
WATER STERILE IRR 500ML POUR (IV SOLUTION) ×1 IMPLANT

## 2022-05-08 NOTE — Anesthesia Preprocedure Evaluation (Signed)
Anesthesia Evaluation  Patient identified by MRN, date of birth, ID band Patient awake    Reviewed: Allergy & Precautions, NPO status , Patient's Chart, lab work & pertinent test results  History of Anesthesia Complications Negative for: history of anesthetic complications  Airway Mallampati: III  TM Distance: >3 FB Neck ROM: Full    Dental no notable dental hx. (+) Teeth Intact   Pulmonary neg pulmonary ROS, neg sleep apnea, neg COPD, Patient abstained from smoking.Not current smoker   Pulmonary exam normal breath sounds clear to auscultation       Cardiovascular Exercise Tolerance: Good METS(-) hypertension(-) CAD and (-) Past MI negative cardio ROS (-) dysrhythmias  Rhythm:Regular Rate:Normal - Systolic murmurs    Neuro/Psych  PSYCHIATRIC DISORDERS Anxiety     negative neurological ROS     GI/Hepatic ,neg GERD  ,,(+)     (-) substance abuse    Endo/Other  diabetes    Renal/GU negative Renal ROS     Musculoskeletal   Abdominal   Peds  Hematology   Anesthesia Other Findings Past Medical History: 07/06/2019: Acute blood loss anemia No date: Anxiety No date: Gestational diabetes No date: Miscarriage No date: Pre-diabetes  Reproductive/Obstetrics                              Anesthesia Physical Anesthesia Plan  ASA: 2  Anesthesia Plan: General   Post-op Pain Management: Tylenol PO (pre-op)*, Gabapentin PO (pre-op)* and Celebrex PO (pre-op)*   Induction: Intravenous  PONV Risk Score and Plan: 4 or greater and Ondansetron, Dexamethasone and Midazolam  Airway Management Planned: Oral ETT and Video Laryngoscope Planned  Additional Equipment: None  Intra-op Plan:   Post-operative Plan: Extubation in OR  Informed Consent: I have reviewed the patients History and Physical, chart, labs and discussed the procedure including the risks, benefits and alternatives for the proposed  anesthesia with the patient or authorized representative who has indicated his/her understanding and acceptance.     Dental advisory given  Plan Discussed with: CRNA and Surgeon  Anesthesia Plan Comments: (Discussed risks of anesthesia with patient, including PONV, sore throat, lip/dental/eye damage. Rare risks discussed as well, such as cardiorespiratory and neurological sequelae, and allergic reactions. Discussed the role of CRNA in patient's perioperative care. Patient understands.)         Anesthesia Quick Evaluation

## 2022-05-08 NOTE — Anesthesia Postprocedure Evaluation (Signed)
Anesthesia Post Note  Patient: Kristie Stevens  Procedure(s) Performed: XI ROBOTIC ASSISTED LAPAROSCOPIC CHOLECYSTECTOMY INDOCYANINE GREEN FLUORESCENCE IMAGING (ICG)  Patient location during evaluation: PACU Anesthesia Type: General Level of consciousness: awake and alert Pain management: pain level controlled Vital Signs Assessment: post-procedure vital signs reviewed and stable Respiratory status: spontaneous breathing, nonlabored ventilation, respiratory function stable and patient connected to nasal cannula oxygen Cardiovascular status: blood pressure returned to baseline and stable Postop Assessment: no apparent nausea or vomiting Anesthetic complications: no   No notable events documented.   Last Vitals:  Vitals:   05/08/22 1830 05/08/22 1845  BP: 117/63 (!) 108/53  Pulse: 100 86  Resp: 16 17  Temp: 36.6 C   SpO2: 97% 98%    Last Pain:  Vitals:   05/08/22 1845  TempSrc:   PainSc: 4                  Corinda Gubler

## 2022-05-08 NOTE — Op Note (Signed)
Robotic assisted laparoscopic Cholecystectomy  Pre-operative Diagnosis: biliary colic  Post-operative Diagnosis: same  Procedure:  Robotic assisted laparoscopic Cholecystectomy  Surgeon: Sterling Big, MD FACS  Anesthesia: Gen. with endotracheal tube  Findings: Chronic mild Cholecystitis   Estimated Blood Loss: 5cc       Specimens: Gallbladder           Complications: none   Procedure Details  The patient was seen again in the Holding Room. The benefits, complications, treatment options, and expected outcomes were discussed with the patient. The risks of bleeding, infection, recurrence of symptoms, failure to resolve symptoms, bile duct damage, bile duct leak, retained common bile duct stone, bowel injury, any of which could require further surgery and/or ERCP, stent, or papillotomy were reviewed with the patient. The likelihood of improving the patient's symptoms with return to their baseline status is good.  The patient and/or family concurred with the proposed plan, giving informed consent.  The patient was taken to Operating Room, identified  and the procedure verified as Laparoscopic Cholecystectomy.  A Time Out was held and the above information confirmed.  Prior to the induction of general anesthesia, antibiotic prophylaxis was administered. VTE prophylaxis was in place. General endotracheal anesthesia was then administered and tolerated well. After the induction, the abdomen was prepped with Chloraprep and draped in the sterile fashion. The patient was positioned in the supine position.  Cut down technique was used to enter the abdominal cavity and a Hasson trochar was placed after two vicryl stitches were anchored to the fascia. Pneumoperitoneum was then created with CO2 and tolerated well without any adverse changes in the patient's vital signs.  Three 8-mm ports were placed under direct vision. All skin incisions  were infiltrated with a local anesthetic agent before making the  incision and placing the trocars.   The patient was positioned  in reverse Trendelenburg, robot was brought to the surgical field and docked in the standard fashion.  We made sure all the instrumentation was kept indirect view at all times and that there were no collision between the arms. I scrubbed out and went to the console.  The gallbladder was identified, the fundus grasped and retracted cephalad. Adhesions were lysed bluntly. The infundibulum was grasped and retracted laterally, exposing the peritoneum overlying the triangle of [place]. This was then divided and exposed in a blunt fashion. An extended critical view of the cystic duct and cystic artery was obtained.  The cystic duct was clearly identified and bluntly dissected.   Artery and duct were double clipped and divided. Using ICG cholangiography we visualize the cystic duct and CBD no  evidence of bile injuries. The gallbladder was taken from the gallbladder fossa in a retrograde fashion with the electrocautery.  Hemostasis was achieved with the electrocautery. nspection of the right upper quadrant was performed. No bleeding, bile duct injury or leak, or bowel injury was noted. Robotic instruments and robotic arms were undocked in the standard fashion.  I scrubbed back in.  The gallbladder was removed and placed in an Endocatch bag.   Pneumoperitoneum was released.  The periumbilical port site was closed with interrumpted 0 Vicryl sutures. 4-0 subcuticular Monocryl was used to close the skin. Dermabond was  applied.  The patient was then extubated and brought to the recovery room in stable condition. Sponge, lap, and needle counts were correct at closure and at the conclusion of the case.               Sterling Big, MD,  FACS  

## 2022-05-08 NOTE — Transfer of Care (Signed)
jImmediate Anesthesia Transfer of Care Note  Patient: Kristie Stevens  Procedure(s) Performed: XI ROBOTIC ASSISTED LAPAROSCOPIC CHOLECYSTECTOMY INDOCYANINE GREEN FLUORESCENCE IMAGING (ICG)  Patient Location: PACU  Anesthesia Type:General  Level of Consciousness: drowsy and patient cooperative  Airway & Oxygen Therapy: Patient Spontanous Breathing and Patient connected to face mask oxygen  Post-op Assessment: Report given to RN and Post -op Vital signs reviewed and stable  Post vital signs: Reviewed and stable  Last Vitals:  Vitals Value Taken Time  BP    Temp    Pulse 98 05/08/22 1753  Resp 19 05/08/22 1753  SpO2 100 % 05/08/22 1753  Vitals shown include unvalidated device data.  Last Pain:  Vitals:   05/08/22 1455  TempSrc: Temporal  PainSc: 0-No pain         Complications: No notable events documented.

## 2022-05-08 NOTE — Interval H&P Note (Signed)
History and Physical Interval Note:  05/08/2022 4:35 PM  Kristie Stevens  has presented today for surgery, with the diagnosis of Biliary Colic, K80.50.  The various methods of treatment have been discussed with the patient and family. After consideration of risks, benefits and other options for treatment, the patient has consented to  Procedure(s): XI ROBOTIC ASSISTED LAPAROSCOPIC CHOLECYSTECTOMY (N/A) INDOCYANINE GREEN FLUORESCENCE IMAGING (ICG) (N/A) as a surgical intervention.  The patient's history has been reviewed, patient examined, no change in status, stable for surgery.  I have reviewed the patient's chart and labs.  Questions were answered to the patient's satisfaction.     Edwardine Deschepper F Olla Delancey

## 2022-05-08 NOTE — Anesthesia Procedure Notes (Signed)
Procedure Name: Intubation Date/Time: 05/08/2022 4:54 PM  Performed by: Mohammed Kindle, CRNAPre-anesthesia Checklist: Patient identified, Emergency Drugs available, Suction available and Patient being monitored Patient Re-evaluated:Patient Re-evaluated prior to induction Oxygen Delivery Method: Circle system utilized Preoxygenation: Pre-oxygenation with 100% oxygen Induction Type: IV induction Ventilation: Mask ventilation without difficulty Laryngoscope Size: McGraph and 3 Grade View: Grade I Tube type: Oral Tube size: 6.5 mm Number of attempts: 1 Airway Equipment and Method: Stylet and Oral airway Placement Confirmation: ETT inserted through vocal cords under direct vision, positive ETCO2, breath sounds checked- equal and bilateral and CO2 detector Secured at: 21 cm Tube secured with: Tape Dental Injury: Teeth and Oropharynx as per pre-operative assessment

## 2022-05-08 NOTE — Discharge Instructions (Addendum)
Laparoscopic Cholecystectomy, Care After   These instructions give you information on caring for yourself after your procedure. Your doctor may also give you more specific instructions. Call your doctor if you have any problems or questions after your procedure.  HOME CARE  Change your bandages (dressings) as told by your doctor.  Keep the wound dry and clean. Wash the wound gently with soap and water. Pat the wound dry with a clean towel.  Do not take baths, swim, or use hot tubs for 2 weeks, or as told by your doctor.  Only take medicine as told by your doctor.  Eat a normal diet as told by your doctor.  Do not lift anything heavier than 20 pounds  until your doctor says it is okay.  Do not play contact sports for 1 week, or as told by your doctor. GET HELP IF:  Your wound is red, puffy (swollen), or painful.  You have yellowish-white fluid (pus) coming from the wound.  You have fluid draining from the wound for more than 1 day.  You have a bad smell coming from the wound.  Your wound breaks open. GET HELP RIGHT AWAY IF:  You have trouble breathing.  You have chest pain.  You have a fever >101  You have pain in the shoulders (shoulder strap areas) that is getting worse.  You feel dizzy or pass out (faint).  You have severe belly (abdominal) pain.  You feel sick to your stomach (nauseous) or throw up (vomit) for more than 1 day.             Colecistectoma laparoscpica, cuidados posteriores  Estas instrucciones le brindan informacin sobre cmo cuidarse despus del procedimiento. Su mdico tambin puede darle instrucciones ms especficas. Llame a su mdico si tiene algn problema o pregunta despus de su procedimiento. CUIDADOS EN EL HOGAR  Cambie sus vendajes (apsitos) segn le indique su mdico.  Mantenga la herida seca y limpia. Lave la herida suavemente con agua y [place]. Seque la herida con palmaditas con una toalla limpia.  No se bae, nade ni use jacuzzis durante 2 semanas,  o segn le indique su mdico.  Tome el medicamento nicamente segn las indicaciones de su mdico.  Consuma una dieta normal segn las indicaciones de su mdico.  No levante nada que pese ms de 20 libras hasta que su mdico lo autorice.  No practique deportes de contacto durante 1 semana, o segn le indique su mdico. OBTENGA AYUDA SI:  Su herida est roja, hinchada o dolorosa.  Tiene lquido blanco amarillento (pus) saliendo de la herida.  Tiene lquido drenando de la herida durante ms de 1 da.  Siente mal olor proveniente de la herida.  Su herida se abre. OBTENGA AYUDA DE INMEDIATO SI:  Tiene problemas para respirar.  Tiene dolor en el pecho.  Tienes fiebre >101  Tiene [employer] hombros (reas de las correas de los hombros) que est empeorando.  Se siente mareado o se desmaya (desmayo).  Tiene dolor intenso en el vientre (abdominal).  Se siente mal del estmago (nuseas) o vomita durante ms de 1 da.            CIRUGIA AMBULATORIA              Instruccionnes de alta    Date Franco Nones) 05/08/2022   1.  Las drogas que se Dispensing optician en su cuerpo [employer], asi que por las proximas 24 horas usted no debe:   Biomedical scientist) un  automovil   Hacer ninguna decision legal   Tomar ninguna bebida alcoholica  2.  A) [place] puede comenzar una dieta regular.  Es mejor que hoy empiece con liquidos y gradualmente anada [address]       B) Puede comer cualquier comida que desee pero es mejor empezar con liquidos, luego sopitas con galletas saladas y gradualmente llegar a las comidas solidas.  3.  Por favor avise a su medico inmediatamente si usted tiene algun sangrado anormal, tiene dificultad con la respiracion, enrojecimiento y Engineer, mining en el sitio de la cirugia, [place], fiebro o dolor que se alivia con [place].  4.  A)  Por favor llame para hacer la cita posoperatoria.  5.  Istrucciones especificas :

## 2022-05-12 LAB — SURGICAL PATHOLOGY

## 2022-05-21 ENCOUNTER — Ambulatory Visit (INDEPENDENT_AMBULATORY_CARE_PROVIDER_SITE_OTHER): Payer: Medicaid Other | Admitting: Physician Assistant

## 2022-05-21 ENCOUNTER — Encounter: Payer: Self-pay | Admitting: Physician Assistant

## 2022-05-21 ENCOUNTER — Other Ambulatory Visit: Payer: Self-pay

## 2022-05-21 VITALS — BP 100/62 | HR 62 | Temp 98.0°F | Ht 62.0 in | Wt 179.0 lb

## 2022-05-21 DIAGNOSIS — Z3689 Encounter for other specified antenatal screening: Secondary | ICD-10-CM

## 2022-05-21 DIAGNOSIS — O403XX1 Polyhydramnios, third trimester, fetus 1: Secondary | ICD-10-CM | POA: Insufficient documentation

## 2022-05-21 DIAGNOSIS — O3680X0 Pregnancy with inconclusive fetal viability, not applicable or unspecified: Secondary | ICD-10-CM | POA: Insufficient documentation

## 2022-05-21 DIAGNOSIS — O24419 Gestational diabetes mellitus in pregnancy, unspecified control: Secondary | ICD-10-CM | POA: Insufficient documentation

## 2022-05-21 DIAGNOSIS — Z09 Encounter for follow-up examination after completed treatment for conditions other than malignant neoplasm: Secondary | ICD-10-CM

## 2022-05-21 DIAGNOSIS — N912 Amenorrhea, unspecified: Secondary | ICD-10-CM | POA: Insufficient documentation

## 2022-05-21 DIAGNOSIS — K8020 Calculus of gallbladder without cholecystitis without obstruction: Secondary | ICD-10-CM

## 2022-05-21 DIAGNOSIS — O34219 Maternal care for unspecified type scar from previous cesarean delivery: Secondary | ICD-10-CM

## 2022-05-21 DIAGNOSIS — K8050 Calculus of bile duct without cholangitis or cholecystitis without obstruction: Secondary | ICD-10-CM

## 2022-05-21 DIAGNOSIS — K8044 Calculus of bile duct with chronic cholecystitis without obstruction: Secondary | ICD-10-CM

## 2022-05-21 HISTORY — DX: Encounter for other specified antenatal screening: Z36.89

## 2022-05-21 NOTE — Progress Notes (Signed)
[facility] POST-OP OFFICE VISIT  05/21/2022  HPI: Kristie Stevens is a 41 y.o. female 13 days s/p robotic assisted laparoscopic cholecystectomy for biliary colic with Dr Everlene Farrier   She is doing well; soreness at umbilicus Otherwise, no fever, chills, nausea, emesis, or bowel changes No issues with incisions Walking well No other complaints   Vital signs: BP 100/62   Pulse 62   Temp 98 F (36.7 C) (Oral)   Ht 5\' 2"  (1.575 m)   Wt 179 lb (81.2 kg)   LMP 04/10/2022 (Approximate)   SpO2 97%   BMI 32.74 kg/m    Physical Exam: Constitutional: Well appearing female, NAD Abdomen: Soft, non-tender, non-distended, no rebound/guarding Skin: Laparoscopic incisions are healing well, no erythema or drainage   Assessment/Plan: This is a 41 y.o. female 13 days s/p robotic assisted laparoscopic cholecystectomy for biliary colic with Dr 46    - Pain control prn  - Reviewed wound care recommendation  - Reviewed lifting restrictions; 4 weeks total  - Reviewed surgical pathology; CCC  - She can follow up on as needed basis; She understands to call with questions/concerns  -- Everlene Farrier, PA-C [facility] 05/21/2022, 11:51 AM M-F: 7am - 4pm

## 2022-05-21 NOTE — Patient Instructions (Addendum)
Please call with any questions or concerns. No heavy lifting greater than 15-20 pounds for the next 2 weeks.  GENERAL POST-OPERATIVE PATIENT INSTRUCTIONS   WOUND CARE INSTRUCTIONS:  Keep a dry clean dressing on the wound if there is drainage. The initial bandage may be removed after 24 hours.  Once the wound has quit draining you may leave it open to air.  If clothing rubs against the wound or causes irritation and the wound is not draining you may cover it with a dry dressing during the daytime.  Try to keep the wound dry and avoid ointments on the wound unless directed to do so.  If the wound becomes bright red and painful or starts to drain infected material that is not clear, please contact your physician immediately.  If the wound is mildly pink and has a thick firm ridge underneath it, this is normal, and is referred to as a healing ridge.  This will resolve over the next 4-6 weeks.  BATHING: You may shower if you have been informed of this by your surgeon. However, Please do not submerge in a tub, hot tub, or pool until incisions are completely sealed or have been told by your surgeon that you may do so.  DIET:  You may eat any foods that you can tolerate.  It is a good idea to eat a high fiber diet and take in plenty of fluids to prevent constipation.  If you do become constipated you may want to take a mild laxative or take ducolax tablets on a daily basis until your bowel habits are regular.  Constipation can be very uncomfortable, along with straining, after recent surgery.  ACTIVITY:  You are encouraged to cough and deep breath or use your incentive spirometer if you were given one, every 15-30 minutes when awake.  This will help prevent respiratory complications and low grade fevers post-operatively if you had a general anesthetic.  You may want to hug a pillow when coughing and sneezing to add additional support to the surgical area, if you had abdominal or chest surgery, which will  decrease pain during these times.  You are encouraged to walk and engage in light activity for the next two weeks.  You should not lift more than 20 pounds for 6 weeks total after surgery as it could put you at increased risk for complications.  Twenty pounds is roughly equivalent to a plastic bag of groceries. At that time- Listen to your body when lifting, if you have pain when lifting, stop and then try again in a few days. Soreness after doing exercises or activities of daily living is normal as you get back in to your normal routine.  MEDICATIONS:  Try to take narcotic medications and anti-inflammatory medications, such as tylenol, ibuprofen, naprosyn, etc., with food.  This will minimize stomach upset from the medication.  Should you develop nausea and vomiting from the pain medication, or develop a rash, please discontinue the medication and contact your physician.  You should not drive, make important decisions, or operate machinery when taking narcotic pain medication.  SUNBLOCK Use sun block to incision area over the next year if this area will be exposed to sun. This helps decrease scarring and will allow you avoid a permanent darkened area over your incision.  QUESTIONS:  Please feel free to call our office if you have any questions, and we will be glad to assist you. [phone]

## 2022-05-22 ENCOUNTER — Encounter: Payer: Medicaid Other | Admitting: Physician Assistant

## 2022-07-21 ENCOUNTER — Other Ambulatory Visit: Payer: Self-pay | Admitting: Internal Medicine

## 2022-07-21 DIAGNOSIS — R590 Localized enlarged lymph nodes: Secondary | ICD-10-CM

## 2022-07-24 ENCOUNTER — Other Ambulatory Visit: Payer: Self-pay | Admitting: Internal Medicine

## 2022-07-24 DIAGNOSIS — R590 Localized enlarged lymph nodes: Secondary | ICD-10-CM

## 2022-08-08 ENCOUNTER — Ambulatory Visit
Admission: RE | Admit: 2022-08-08 | Discharge: 2022-08-08 | Disposition: A | Discharge status: Home / Self Care | Payer: Medicaid Other | Source: Ambulatory Visit | Attending: Internal Medicine | Admitting: Internal Medicine

## 2022-08-08 DIAGNOSIS — R590 Localized enlarged lymph nodes: Secondary | ICD-10-CM | POA: Insufficient documentation

## 2022-08-08 LAB — POCT I-STAT CREATININE: Creatinine, Ser: 0.6 mg/dL (ref 0.44–1.00)

## 2022-08-08 MED ORDER — IOHEXOL 300 MG/ML  SOLN
75.0000 mL | Freq: Once | INTRAMUSCULAR | Status: AC | PRN
  Administered 2022-08-08: 75 mL via INTRAVENOUS
